# Patient Record
Sex: Female | Born: 1985 | Hispanic: Yes | Marital: Married | Smoking: Never smoker
Health system: Southern US, Community
[De-identification: ages and names within clinical notes are randomized; demographics above are authoritative.]

## PROBLEM LIST (undated history)

## (undated) DIAGNOSIS — R519 Headache, unspecified: Secondary | ICD-10-CM

## (undated) DIAGNOSIS — R51 Headache: Secondary | ICD-10-CM

## (undated) HISTORY — DX: Headache: R51

## (undated) HISTORY — DX: Headache, unspecified: R51.9

## (undated) SURGERY — Surgical Case
Anesthesia: *Unknown

---

## 2014-10-01 DIAGNOSIS — M25552 Pain in left hip: Secondary | ICD-10-CM | POA: Insufficient documentation

## 2016-03-13 ENCOUNTER — Ambulatory Visit
Admission: EM | Admit: 2016-03-13 | Discharge: 2016-03-13 | Disposition: A | Discharge status: Home / Self Care | Payer: BC Managed Care – PPO | Attending: Family Medicine | Admitting: Family Medicine

## 2016-03-13 ENCOUNTER — Encounter: Payer: Self-pay | Admitting: *Deleted

## 2016-03-13 DIAGNOSIS — G43009 Migraine without aura, not intractable, without status migrainosus: Secondary | ICD-10-CM

## 2016-03-13 LAB — RAPID INFLUENZA A&B ANTIGENS: Influenza A (ARMC): NEGATIVE

## 2016-03-13 LAB — RAPID STREP SCREEN (MED CTR MEBANE ONLY): Streptococcus, Group A Screen (Direct): NEGATIVE

## 2016-03-13 LAB — RAPID INFLUENZA A&B ANTIGENS (ARMC ONLY): INFLUENZA B (ARMC): NEGATIVE

## 2016-03-13 MED ORDER — ONDANSETRON 8 MG PO TBDP
8.0000 mg | ORAL_TABLET | Freq: Once | ORAL | Status: AC
  Administered 2016-03-13: 8 mg via ORAL

## 2016-03-13 MED ORDER — ONDANSETRON 8 MG PO TBDP: 8.0000 mg | ORAL_TABLET | Freq: Two times a day (BID) | ORAL | Status: DC

## 2016-03-13 MED ORDER — NAPROXEN 500 MG PO TABS: 500.0000 mg | ORAL_TABLET | Freq: Two times a day (BID) | ORAL | Status: DC

## 2016-03-13 MED ORDER — KETOROLAC TROMETHAMINE 60 MG/2ML IM SOLN
60.0000 mg | Freq: Once | INTRAMUSCULAR | Status: AC
  Administered 2016-03-13: 60 mg via INTRAMUSCULAR

## 2016-03-13 MED ORDER — ONDANSETRON 8 MG PO TBDP: 8.0000 mg | ORAL_TABLET | Freq: Once | ORAL | Status: DC

## 2016-03-13 NOTE — ED Notes (Signed)
Frontal and occipital region head pain, N/V, chills body aches, weakness, and sore throat since approx 1600 today.

## 2016-03-13 NOTE — Discharge Instructions (Signed)
Recurrent Migraine Headache A migraine headache is an intense, throbbing pain on one or both sides of your head. Recurrent migraines keep coming back. A migraine can last for 30 minutes to several hours. CAUSES  The exact cause of a migraine headache is not always known. However, a migraine may be caused when nerves in the brain become irritated and release chemicals that cause inflammation. This causes pain. Certain things may also trigger migraines, such as:   Alcohol.  Smoking.  Stress.  Menstruation.  Aged cheeses.  Foods or drinks that contain nitrates, glutamate, aspartame, or tyramine.  Lack of sleep.  Chocolate.  Caffeine.  Hunger.  Physical exertion.  Fatigue.  Medicines used to treat chest pain (nitroglycerine), birth control pills, estrogen, and some blood pressure medicines. SYMPTOMS   Pain on one or both sides of your head.  Pulsating or throbbing pain.  Severe pain that prevents daily activities.  Pain that is aggravated by any physical activity.  Nausea, vomiting, or both.  Dizziness.  Pain with exposure to bright lights, loud noises, or activity.  General sensitivity to bright lights, loud noises, or smells. Before you get a migraine, you may get warning signs that a migraine is coming (aura). An aura may include:  Seeing flashing lights.  Seeing bright spots, halos, or zigzag lines.  Having tunnel vision or blurred vision.  Having feelings of numbness or tingling.  Having trouble talking.  Having muscle weakness. DIAGNOSIS  A recurrent migraine headache is often diagnosed based on:  Symptoms.  Physical examination.  A CT scan or MRI of your head. These imaging tests cannot diagnose migraines but can help rule out other causes of headaches.  TREATMENT  Medicines may be given for pain and nausea. Medicines can also be given to help prevent recurrent migraines. HOME CARE INSTRUCTIONS  Only take over-the-counter or prescription  medicines for pain or discomfort as directed by your health care provider. The use of long-term narcotics is not recommended.  Lie down in a dark, quiet room when you have a migraine.  Keep a journal to find out what may trigger your migraine headaches. For example, write down:  What you eat and drink.  How much sleep you get.  Any change to your diet or medicines.  Limit alcohol consumption.  Quit smoking if you smoke.  Get 7-9 hours of sleep, or as recommended by your health care provider.  Limit stress.  Keep lights dim if bright lights bother you and make your migraines worse. SEEK MEDICAL CARE IF:   You do not get relief from the medicines given to you.  You have a recurrence of pain.  You have a fever. SEEK IMMEDIATE MEDICAL CARE IF:  Your migraine becomes severe.  You have a stiff neck.  You have loss of vision.  You have muscular weakness or loss of muscle control.  You start losing your balance or have trouble walking.  You feel faint or pass out.  You have severe symptoms that are different from your first symptoms. MAKE SURE YOU:   Understand these instructions.  Will watch your condition.  Will get help right away if you are not doing well or get worse.   This information is not intended to replace advice given to you by your health care provider. Make sure you discuss any questions you have with your health care provider.   Document Released: 08/07/2001 Document Revised: 12/03/2014 Document Reviewed: 07/20/2013 Elsevier Interactive Patient Education 2016 [employer]  

## 2016-03-13 NOTE — ED Provider Notes (Signed)
CSN: [id number]     Arrival date & time 03/13/16  1902 History   First MD Initiated Contact with Patient 03/13/16 2104     Chief Complaint  Patient presents with  . Headache  . Nausea  . Emesis  . Weakness  . Chills  . Generalized Body Aches   (Consider location/radiation/quality/duration/timing/severity/associated sxs/prior Treatment) HPI  A 30 year old female who is accompanied by her husband complaining of frontal temporal and occipital head pain which she describes as a pressure. She has this along with nausea vomiting body  aches weakness and sore throat that started about 1600 hrs. today. She has a history of migraine headaches; she was seeing a physician who had her on propranolol prophylactically, she discontinued this on her own about 2 months ago because of minor palpitations. She does not have any photophobia today. Her migraines would last 2-3 hours and then seemed to abate but this one has been in progress  5 hours. Pain right now is 8 out of 10.    History reviewed. No pertinent past medical history. History reviewed. No pertinent past surgical history. History reviewed. No pertinent family history. Social History  Substance Use Topics  . Smoking status: Never Smoker   . Smokeless tobacco: None  . Alcohol Use: Yes   OB History    No data available     Review of Systems  Constitutional: Positive for activity change and fatigue. Negative for fever and chills.  Eyes: Negative for photophobia and visual disturbance.  Neurological: Positive for weakness and headaches.  All other systems reviewed and are negative.   Allergies  Review of patient's allergies indicates no known allergies.  Home Medications   Prior to Admission medications   Medication Sig Start Date End Date Taking? Authorizing Provider  naproxen (NAPROSYN) 500 MG tablet Take 1 tablet (500 mg total) by mouth 2 (two) times daily with a meal. 03/13/16   Lutricia Feil, PA-C  ondansetron (ZOFRAN ODT)  8 MG disintegrating tablet Take 1 tablet (8 mg total) by mouth 2 (two) times daily. 03/13/16   Lutricia Feil, PA-C   Meds Ordered and Administered this Visit   Medications  ondansetron (ZOFRAN-ODT) disintegrating tablet 8 mg (not administered)  ketorolac (TORADOL) injection 60 mg (60 mg Intramuscular Given 03/13/16 2124)  ondansetron (ZOFRAN-ODT) disintegrating tablet 8 mg (8 mg Oral Given 03/13/16 2119)    BP 118/76 mmHg  Pulse 79  Temp(Src) 97.8 F (36.6 C) (Oral)  Resp 16  Ht  (1.6 m)  Wt 132 lb (59.875 kg)  BMI 23.39 kg/m2  SpO2 100%  LMP 02/11/2016 (Approximate) No data found.   Physical Exam  Constitutional: She is oriented to person, place, and time. She appears well-developed and well-nourished. No distress.  HENT:  Head: Normocephalic and atraumatic.  Eyes: Conjunctivae and EOM are normal. Pupils are equal, round, and reactive to light. Right eye exhibits no discharge. Left eye exhibits no discharge.  Neck: Normal range of motion. Neck supple.  Musculoskeletal: Normal range of motion. She exhibits no edema or tenderness.  Lymphadenopathy:    She has no cervical adenopathy.  Neurological: She is alert and oriented to person, place, and time. She has normal reflexes. She displays normal reflexes. No cranial nerve deficit. She exhibits normal muscle tone. Coordination abnormal.  Skin: Skin is warm and dry. She is not diaphoretic.  Psychiatric: She has a normal mood and affect. Her behavior is normal. Judgment and thought content normal.  Nursing note and vitals reviewed.  ED Course  Procedures (including critical care time)  Labs Review Labs Reviewed  RAPID INFLUENZA A&B ANTIGENS ([facility] ONLY)  RAPID STREP SCREEN (NOT AT [facility])  CULTURE, GROUP A STREP [facility])    Imaging Review No results found.   Visual Acuity Review  Right Eye Distance:   Left Eye Distance:   Bilateral Distance:    Right Eye Near:   Left Eye Near:    Bilateral Near:       Medications  ondansetron (ZOFRAN-ODT) disintegrating tablet 8 mg (not administered)  ketorolac (TORADOL) injection 60 mg (60 mg Intramuscular Given 03/13/16 2124)  ondansetron (ZOFRAN-ODT) disintegrating tablet 8 mg (8 mg Oral Given 03/13/16 2119)     MDM   1. Migraine without aura and without status migrainosus, not intractable    New Prescriptions   NAPROXEN (NAPROSYN) 500 MG TABLET    Take 1 tablet (500 mg total) by mouth 2 (two) times daily with a meal.   ONDANSETRON (ZOFRAN ODT) 8 MG DISINTEGRATING TABLET    Take 1 tablet (8 mg total) by mouth 2 (two) times daily.  Plan: 1. Test/x-ray results and diagnosis reviewed with patient 2. rx as per orders; risks, benefits, potential side effects reviewed with patient 3. Recommend supportive treatment with A beta blocker to figure out triggers. Recommend she return to her physician who had prescribed migraine medication the past to reestablish prophylactic migraine medicines that do not cause side effects. In meantime given a prescription for an anti-inflammatory medication and also Zofran for to assist her with nausea if she has another episode. At the time of discharge her up headache scale had fallen to 1 out of 10 when she came in it was a 9 out of 10. 4. F/u prn if symptoms worsen or don't improve      Lutricia FeilWilliam P Roemer, PA-C 03/13/16 2206

## 2016-03-15 LAB — CULTURE, GROUP A STREP (THRC)

## 2016-08-28 ENCOUNTER — Ambulatory Visit: Payer: BC Managed Care – PPO

## 2016-08-28 VITALS — BP 123/82 | HR 107 | Wt 136.0 lb

## 2016-08-28 DIAGNOSIS — Z113 Encounter for screening for infections with a predominantly sexual mode of transmission: Secondary | ICD-10-CM

## 2016-08-28 DIAGNOSIS — N926 Irregular menstruation, unspecified: Secondary | ICD-10-CM

## 2016-08-28 DIAGNOSIS — Z331 Pregnant state, incidental: Secondary | ICD-10-CM

## 2016-08-28 NOTE — Progress Notes (Unsigned)
Sandra Cardenas presents for NOB nurse interview visit. Pregnancy confirmation done ______.  G- 1.  P- .0 Pregnancy education material explained and given. _No__ cats in the home. NOB labs ordered.Marland Kitchen. HIV labs and Drug screen were explained optional and she did not decline. Drug screen ordered. PNV encouraged. Genetic screening options discussed. Genetic testing:   Pt may discuss with provider. Pt. To follow up with provider in _2_ weeks for NOB physical.  All questions answered.

## 2016-08-30 LAB — URINALYSIS, ROUTINE W REFLEX MICROSCOPIC
Bilirubin, UA: NEGATIVE
Glucose, UA: NEGATIVE
Ketones, UA: NEGATIVE
Leukocytes, UA: NEGATIVE
NITRITE UA: NEGATIVE
Protein, UA: NEGATIVE
RBC, UA: NEGATIVE
Specific Gravity, UA: 1.012 (ref 1.005–1.030)
Urobilinogen, Ur: 0.2 mg/dL (ref 0.2–1.0)
pH, UA: 6.5 (ref 5.0–7.5)

## 2016-08-30 LAB — CBC WITH DIFFERENTIAL/PLATELET
BASOS ABS: 0 10*3/uL (ref 0.0–0.2)
Basos: 0 %
EOS (ABSOLUTE): 0.1 10*3/uL (ref 0.0–0.4)
Eos: 1 %
HEMOGLOBIN: 11.7 g/dL (ref 11.1–15.9)
Hematocrit: 35.1 % (ref 34.0–46.6)
IMMATURE GRANS (ABS): 0.1 10*3/uL (ref 0.0–0.1)
Immature Granulocytes: 1 %
LYMPHS: 16 %
Lymphocytes Absolute: 2.1 10*3/uL (ref 0.7–3.1)
MCH: 31.9 pg (ref 26.6–33.0)
MCHC: 33.3 g/dL (ref 31.5–35.7)
MCV: 96 fL (ref 79–97)
MONOCYTES: 7 %
Monocytes Absolute: 0.9 10*3/uL (ref 0.1–0.9)
NEUTROS PCT: 75 %
Neutrophils Absolute: 9.6 10*3/uL — ABNORMAL HIGH (ref 1.4–7.0)
PLATELETS: 384 10*3/uL — AB (ref 150–379)
RBC: 3.67 x10E6/uL — AB (ref 3.77–5.28)
RDW: 13.4 % (ref 12.3–15.4)
WBC: 12.9 10*3/uL — AB (ref 3.4–10.8)

## 2016-08-30 LAB — URINE CULTURE: ORGANISM ID, BACTERIA: NO GROWTH

## 2016-08-30 LAB — MONITOR DRUG PROFILE 14(MW)
AMPHETAMINE SCREEN URINE: NEGATIVE ng/mL
BARBITURATE SCREEN URINE: NEGATIVE ng/mL
BENZODIAZEPINE SCREEN, URINE: NEGATIVE ng/mL
BUPRENORPHINE, URINE: NEGATIVE ng/mL
CANNABINOIDS UR QL SCN: NEGATIVE ng/mL
Cocaine (Metab) Scrn, Ur: NEGATIVE ng/mL
Creatinine(Crt), U: 103.5 mg/dL (ref 20.0–300.0)
Fentanyl, Urine: NEGATIVE pg/mL
Meperidine Screen, Urine: NEGATIVE ng/mL
Methadone Screen, Urine: NEGATIVE ng/mL
OPIATE SCREEN URINE: NEGATIVE ng/mL
OXYCODONE+OXYMORPHONE UR QL SCN: NEGATIVE ng/mL
PH UR, DRUG SCRN: 6.1 (ref 4.5–8.9)
PHENCYCLIDINE QUANTITATIVE URINE: NEGATIVE ng/mL
PROPOXYPHENE SCREEN URINE: NEGATIVE ng/mL
SPECIFIC GRAVITY: 1.009
Tramadol Screen, Urine: NEGATIVE ng/mL

## 2016-08-30 LAB — RUBELLA SCREEN: Rubella Antibodies, IGG: 1.18 index (ref >0.99)

## 2016-08-30 LAB — ABO AND RH: Rh Factor: POSITIVE

## 2016-08-30 LAB — HEP, RPR, HIV PANEL
HEP B S AG: NEGATIVE
HIV Screen 4th Generation wRfx: NONREACTIVE
RPR: NONREACTIVE

## 2016-08-30 LAB — VARICELLA ZOSTER ANTIBODY, IGG: VARICELLA: 2545 {index} (ref >165)

## 2016-08-30 LAB — ANTIBODY SCREEN: ANTIBODY SCREEN: NEGATIVE

## 2016-08-31 LAB — GC/CHLAMYDIA PROBE AMP
Chlamydia trachomatis, NAA: NEGATIVE
Neisseria gonorrhoeae by PCR: NEGATIVE

## 2016-09-26 ENCOUNTER — Encounter: Payer: BC Managed Care – PPO | Admitting: Obstetrics and Gynecology

## 2016-09-27 ENCOUNTER — Other Ambulatory Visit: Payer: Self-pay | Admitting: Obstetrics and Gynecology

## 2016-09-27 ENCOUNTER — Ambulatory Visit: Payer: BC Managed Care – PPO | Admitting: Obstetrics and Gynecology

## 2016-09-27 ENCOUNTER — Ambulatory Visit (INDEPENDENT_AMBULATORY_CARE_PROVIDER_SITE_OTHER): Payer: BC Managed Care – PPO | Admitting: Obstetrics and Gynecology

## 2016-09-27 VITALS — BP 114/71 | HR 87 | Wt 131.4 lb

## 2016-09-27 DIAGNOSIS — Z23 Encounter for immunization: Secondary | ICD-10-CM

## 2016-09-27 DIAGNOSIS — Z3492 Encounter for supervision of normal pregnancy, unspecified, second trimester: Secondary | ICD-10-CM

## 2016-09-27 NOTE — Progress Notes (Signed)
NEW OB HISTORY AND PHYSICAL  SUBJECTIVE:       Sandra LoganMaria Cardenas is a 30 y.o. G1P0 female, Patient's last menstrual period was 07/02/2016., Estimated Date of Delivery: 04/08/17, [id number], presents today for establishment of Prenatal Care. She has no unusual complaints and complains of nothing now.      Gynecologic History Patient's last menstrual period was 07/02/2016. Normal Contraception: none Last Pap: ?Marland Kitchen. Results were: normal  Obstetric History OB History  Gravida Para Term Preterm AB Living  1            SAB TAB Ectopic Multiple Live Births               # Outcome Date GA Lbr Len/2nd Weight Sex Delivery Anes PTL Lv  1 Current               Past Medical History:  Diagnosis Date  . Frequent headaches     No past surgical history on file.  Current Outpatient Prescriptions on File Prior to Visit  Medication Sig Dispense Refill  . Prenatal Vit-Fe Fumarate-FA (PRENATAL MULTIVITAMIN) TABS tablet Take 1 tablet by mouth daily at 12 noon.    . naproxen (NAPROSYN) 500 MG tablet Take 1 tablet (500 mg total) by mouth 2 (two) times daily with a meal. (Patient not taking: Reported on 09/27/2016) 60 tablet 0  . ondansetron (ZOFRAN ODT) 8 MG disintegrating tablet Take 1 tablet (8 mg total) by mouth 2 (two) times daily. (Patient not taking: Reported on 09/27/2016) 6 tablet 0   No current facility-administered medications on file prior to visit.     Allergies  Allergen Reactions  . Penicillins     Social History   Social History  . Marital status: Married    Spouse name: N/A  . Number of children: N/A  . Years of education: N/A   Occupational History  . Not on file.   Social History Main Topics  . Smoking status: Never Smoker  . Smokeless tobacco: Never Used  . Alcohol use Yes  . Drug use: No  . Sexual activity: Yes    Birth control/ protection: None   Other Topics Concern  . Not on file   Social History Narrative  . No narrative on file    No family history on  file.  The following portions of the patient's history were reviewed and updated as appropriate: allergies, current medications, past OB history, past medical history, past surgical history, past family history, past social history, and problem list.    OBJECTIVE: Initial Physical Exam (New OB)  GENERAL APPEARANCE: alert, well appearing, in no apparent distress, oriented to person, place and time HEAD: normocephalic, atraumatic MOUTH: mucous membranes moist, pharynx normal without lesions and dental hygiene good THYROID: no thyromegaly or masses present BREASTS: not examined LUNGS: clear to auscultation, no wheezes, rales or rhonchi, symmetric air entry HEART: regular rate and rhythm, no murmurs ABDOMEN: soft, nontender, nondistended, no abnormal masses, no epigastric pain, fundus not palpable and FHT present EXTREMITIES: no redness or tenderness in the calves or thighs SKIN: normal coloration and turgor, no rashes LYMPH NODES: no adenopathy palpable NEUROLOGIC: alert, oriented, normal speech, no focal findings or movement disorder noted  PELVIC EXAM EXTERNAL GENITALIA: normal appearing vulva with no masses, tenderness or lesions VAGINA: no abnormal discharge or lesions CERVIX: no lesions or cervical motion tenderness UTERUS: gravid and consistent with 12 weeks ADNEXA: no masses palpable and nontender  ASSESSMENT: Normal pregnancy  PLAN: Prenatal care Desires genetic screening-obtained today  See orders

## 2016-09-27 NOTE — Patient Instructions (Signed)

## 2016-09-27 NOTE — Progress Notes (Signed)
NOB physical- pt is doing well, states she is having some cramping in her lower abd

## 2016-10-01 LAB — CYTOLOGY - PAP

## 2016-10-04 ENCOUNTER — Encounter: Payer: Self-pay | Admitting: Obstetrics and Gynecology

## 2016-10-05 ENCOUNTER — Telehealth: Payer: Self-pay | Admitting: Obstetrics and Gynecology

## 2016-10-05 ENCOUNTER — Encounter: Payer: Self-pay | Admitting: Obstetrics and Gynecology

## 2016-10-05 NOTE — Telephone Encounter (Signed)
PT CALLED AND LEFT A OFFICE MESSAGE STATING SHE WOULD LIKE FOR YOU TO CALL HER BACK.

## 2016-10-05 NOTE — Telephone Encounter (Signed)
Spoke with pt

## 2016-10-08 ENCOUNTER — Encounter: Payer: Self-pay | Admitting: Obstetrics and Gynecology

## 2016-10-11 ENCOUNTER — Encounter: Payer: Self-pay | Admitting: Obstetrics and Gynecology

## 2016-10-25 ENCOUNTER — Ambulatory Visit (INDEPENDENT_AMBULATORY_CARE_PROVIDER_SITE_OTHER): Payer: BC Managed Care – PPO | Admitting: Obstetrics and Gynecology

## 2016-10-25 VITALS — BP 111/70 | HR 111 | Wt 137.4 lb

## 2016-10-25 DIAGNOSIS — Z3402 Encounter for supervision of normal first pregnancy, second trimester: Secondary | ICD-10-CM

## 2016-10-25 LAB — POCT URINALYSIS DIPSTICK
BILIRUBIN UA: NEGATIVE
GLUCOSE UA: NEGATIVE
KETONES UA: NEGATIVE
Nitrite, UA: NEGATIVE
PH UA: 6.5
Protein, UA: NEGATIVE
RBC UA: NEGATIVE
Spec Grav, UA: <=1.005
Urobilinogen, UA: NEGATIVE

## 2016-10-25 NOTE — Progress Notes (Signed)
ROB: Doing well, no complaints. Nausea improved.  Received flu vaccine last visit. RTC in 4 weeks. For anatomy scan next visit.

## 2016-10-25 NOTE — Patient Instructions (Addendum)
Second Trimester of Pregnancy The second trimester is from week 13 through week 28 (months 4 through 6). The second trimester is often a time when you feel your best. Your body has also adjusted to being pregnant, and you begin to feel better physically. Usually, morning sickness has lessened or quit completely, you may have more energy, and you may have an increase in appetite. The second trimester is also a time when the fetus is growing rapidly. At the end of the sixth month, the fetus is about 9 inches long and weighs about 1 pounds. You will likely begin to feel the baby move (quickening) between 18 and 20 weeks of the pregnancy. Body changes during your second trimester Your body continues to go through many changes during your second trimester. The changes vary from woman to woman.  Your weight will continue to increase. You will notice your lower abdomen bulging out.  You may begin to get stretch marks on your hips, abdomen, and breasts.  You may develop headaches that can be relieved by medicines. The medicines should be approved by your health care provider.  You may urinate more often because the fetus is pressing on your bladder.  You may develop or continue to have heartburn as a result of your pregnancy.  You may develop constipation because certain hormones are causing the muscles that push waste through your intestines to slow down.  You may develop hemorrhoids or swollen, bulging veins (varicose veins).  You may have back pain. This is caused by:  Weight gain.  Pregnancy hormones that are relaxing the joints in your pelvis.  A shift in weight and the muscles that support your balance.  Your breasts will continue to grow and they will continue to become tender.  Your gums may bleed and may be sensitive to brushing and flossing.  Dark spots or blotches (chloasma, mask of pregnancy) may develop on your face. This will likely fade after the baby is born.  A dark line  from your belly button to the pubic area (linea nigra) may appear. This will likely fade after the baby is born.  You may have changes in your hair. These can include thickening of your hair, rapid growth, and changes in texture. Some women also have hair loss during or after pregnancy, or hair that feels dry or thin. Your hair will most likely return to normal after your baby is born. What to expect at prenatal visits During a routine prenatal visit:  You will be weighed to make sure you and the fetus are growing normally.  Your blood pressure will be taken.  Your abdomen will be measured to track your baby's growth.  The fetal heartbeat will be listened to.  Any test results from the previous visit will be discussed. Your health care provider may ask you:  How you are feeling.  If you are feeling the baby move.  If you have had any abnormal symptoms, such as leaking fluid, bleeding, severe headaches, or abdominal cramping.  If you are using any tobacco products, including cigarettes, chewing tobacco, and electronic cigarettes.  If you have any questions. Other tests that may be performed during your second trimester include:  Blood tests that check for:  Low iron levels (anemia).  Gestational diabetes (between 24 and 28 weeks).  Rh antibodies. This is to check for a protein on red blood cells (Rh factor).  Urine tests to check for infections, diabetes, or protein in the urine.  An ultrasound to   confirm the proper growth and development of the baby.  An amniocentesis to check for possible genetic problems.  Fetal screens for spina bifida and Down syndrome.  HIV (human immunodeficiency virus) testing. Routine prenatal testing includes screening for HIV, unless you choose not to have this test. Follow these instructions at home: Eating and drinking  Continue to eat regular, healthy meals.  Avoid raw meat, uncooked cheese, cat litter boxes, and soil used by cats. These  carry germs that can cause birth defects in the baby.  Take your prenatal vitamins.  Take 1500-2000 mg of calcium daily starting at the 20th week of pregnancy until you deliver your baby.  If you develop constipation:  Take over-the-counter or prescription medicines.  Drink enough fluid to keep your urine clear or pale yellow.  Eat foods that are high in fiber, such as fresh fruits and vegetables, whole grains, and beans.  Limit foods that are high in fat and processed sugars, such as fried and sweet foods. Activity  Exercise only as directed by your health care provider. Experiencing uterine cramps is a good sign to stop exercising.  Avoid heavy lifting, wear low heel shoes, and practice good posture.  Wear your seat belt at all times when driving.  Rest with your legs elevated if you have leg cramps or low back pain.  Wear a good support bra for breast tenderness.  Do not use hot tubs, steam rooms, or saunas. Lifestyle  Avoid all smoking, herbs, alcohol, and unprescribed drugs. These chemicals affect the formation and growth of the baby.  Do not use any products that contain nicotine or tobacco, such as cigarettes and e-cigarettes. If you need help quitting, ask your health care provider.  A sexual relationship may be continued unless your health care provider directs you otherwise. General instructions  Follow your health care provider's instructions regarding medicine use. There are medicines that are either safe or unsafe to take during pregnancy.  Take warm sitz baths to soothe any pain or discomfort caused by hemorrhoids. Use hemorrhoid cream if your health care provider approves.  If you develop varicose veins, wear support hose. Elevate your feet for 15 minutes, 3-4 times a day. Limit salt in your diet.  Visit your dentist if you have not gone yet during your pregnancy. Use a soft toothbrush to brush your teeth and be gentle when you floss.  Keep all follow-up  prenatal visits as told by your health care provider. This is important. Contact a health care provider if:  You have dizziness.  You have mild pelvic cramps, pelvic pressure, or nagging pain in the abdominal area.  You have persistent nausea, vomiting, or diarrhea.  You have a bad smelling vaginal discharge.  You have pain with urination. Get help right away if:  You have a fever.  You are leaking fluid from your vagina.  You have spotting or bleeding from your vagina.  You have severe abdominal cramping or pain.  You have rapid weight gain or weight loss.  You have shortness of breath with chest pain.  You notice sudden or extreme swelling of your face, hands, ankles, feet, or legs.  You have not felt your baby move in over an hour.  You have severe headaches that do not go away with medicine.  You have vision changes. Summary  The second trimester is from week 13 through week 28 (months 4 through 6). It is also a time when the fetus is growing rapidly.  Your body goes   through many changes during pregnancy. The changes vary from woman to woman.  Avoid all smoking, herbs, alcohol, and unprescribed drugs. These chemicals affect the formation and growth your baby.  Do not use any tobacco products, such as cigarettes, chewing tobacco, and e-cigarettes. If you need help quitting, ask your health care provider.  Contact your health care provider if you have any questions. Keep all prenatal visits as told by your health care provider. This is important. This information is not intended to replace advice given to you by your health care provider. Make sure you discuss any questions you have with your health care provider. Document Released: 11/06/2001 Document Revised: 04/19/2016 Document Reviewed: 01/13/2013 Elsevier Interactive Patient Education  2017 [employer] Exercise During Pregnancy For people of all ages, exercise is an important part of being healthy. Exercise  improves heart and lung function and helps to maintain strength, flexibility, and a healthy body weight. Exercise also boosts energy levels and elevates mood. For most women, maintaining an exercise routine throughout pregnancy is recommended. It is only on rare occasions and with certain medical conditions or pregnancy complications that women may be asked to limit or avoid exercise during pregnancy. What are some other benefits to exercising during pregnancy? Along with maintaining strength and flexibility, exercising throughout pregnancy can help to:  Keep strength in muscles that are very important during labor and childbirth.  Decrease low back pain during pregnancy.  Decrease the risk of developing gestational diabetes mellitus (GDM).  Improve blood sugar (glucose) control for women who have GDM.  Decrease the risk of developing preeclampsia. This is a serious condition that causes high blood pressure along with other symptoms, such as swelling and headaches.  Decrease the risk of cesarean delivery.  Speed up the recovery after giving birth. How often should I exercise? Unless your health care provider gives you different instructions, you should try to exercise on most days or all days of the week. In general, try to exercise with moderate intensity for about 150 minutes per week. This can be spread out across several days, such as exercising for 30 minutes per day on 5 days of each week. You can tell that you are exercising at a moderate intensity if you have a higher heart rate and faster breathing, but you are still able to hold a conversation. What types of moderate-intensity exercise are recommended during pregnancy? There are many types of exercise that are safe for you to do during pregnancy. Unless your health care provider gives you different instructions, do a variety of exercises that safely increase your heart and breathing (cardiopulmonary) rates and help you to build and  maintain muscle strength (strength training). You should always be able to talk in full sentences while exercising during pregnancy. Some examples of exercising that is safe to do during pregnancy include:  Brisk walking or hiking.  Swimming.  Water aerobics.  Riding a stationary bike.  Strength training.  Modified yoga or Pilates. Tell your instructor that you are pregnant. Avoid overstretching and avoid lying on your back for long periods of time.  Running or jogging. Only choose this type of exercise if:  You ran or jogged regularly before your pregnancy.  You can run or jog and still talk in complete sentences. What types of exercise should I not do during pregnancy? Depending on your level of fitness and whether you exercised regularly before your pregnancy, you may be advised to limit vigorous-intensity exercise during your pregnancy. You can tell that  you are exercising at a vigorous intensity if you are breathing much harder and faster and cannot hold a conversation while exercising. Some examples of exercising that you should avoid during pregnancy include:  Contact sports.  Activities that place you at risk for falling on or being hit in the belly, such as downhill skiing, water skiing, surfing, rock climbing, cycling, gymnastics, and horseback riding.  Scuba diving.  Sky diving.  Yoga or Pilates in a room that is heated to extreme temperatures ("hot yoga" or "hot Pilates").  Jogging or running, unless you ran or jogged regularly before your pregnancy. While jogging or running, you should always be able to talk in full sentences. Do not run or jog so vigorously that you are unable to have a conversation.  If you are not used to exercising at elevation (more than 6,000 feet above sea level), do not do so during your pregnancy. When should I avoid exercising during pregnancy? Certain medical conditions can make it unsafe to exercise during pregnancy, or they may increase  your risk of miscarriage or early labor and birth. Some of these conditions include:  Some types of heart disease.  Some types of lung disease.  Placenta previa. This is when the placenta partially or completely covers the opening of the uterus (cervix).  Frequent bleeding from the vagina during your pregnancy.  Incompetent cervix. This is when your cervix does not remain as tightly closed during pregnancy as it should.  Premature labor.  Ruptured membranes. This is when the protective sac (amniotic sac) opens up and amniotic fluid leaks from your vagina.  Severely low blood count (anemia).  Preeclampsia or pregnancy-caused high blood pressure.  Carrying more than one baby (multiple gestation) and having an additional risk of early labor.  Poorly controlled diabetes.  Being severely underweight or severely overweight.  Intrauterine growth restriction. This is when your baby's growth and development during pregnancy are slower than expected.  Other medical conditions. Ask your health care provider if any apply to you. What else should I know about exercising during pregnancy? You should take these precautions while exercising during pregnancy:  Avoid overheating.  Wear loose-fitting, breathable clothes.  Do not exercise in very high temperatures.  Avoid dehydration. Drink enough water before, during, and after exercise to keep your urine clear or pale yellow.  Avoid overstretching. Because of hormone changes during pregnancy, it is easy to overstretch muscles, tendons, and ligaments during pregnancy.  Start slowly and ask your health care provider to recommend types of exercise that are safe for you, if exercising regularly is new for you. Pregnancy is not a time for exercising to lose weight. When should I seek medical care? You should stop exercising and call your health care provider if you have any unusual symptoms, such as:  Mild uterine contractions or abdominal  cramping.  Dizziness that does not improve with rest. When should I seek immediate medical care? You should stop exercising and call your local emergency services (911 in the [place] if you have any unusual symptoms, such as:  Sudden, severe pain in your low back or your belly.  Uterine contractions or abdominal cramping that do not improve with rest.  Chest pain.  Bleeding or fluid leaking from your vagina.  Shortness of breath. This information is not intended to replace advice given to you by your health care provider. Make sure you discuss any questions you have with your health care provider. Document Released: 11/12/2005 Document Revised: 04/11/2016 Document Reviewed: 01/20/2015  Elsevier Interactive Patient Education  2017 [employer]  

## 2016-11-16 ENCOUNTER — Encounter: Payer: BC Managed Care – PPO | Admitting: Certified Nurse Midwife

## 2016-11-16 ENCOUNTER — Ambulatory Visit (INDEPENDENT_AMBULATORY_CARE_PROVIDER_SITE_OTHER): Payer: BC Managed Care – PPO | Admitting: Certified Nurse Midwife

## 2016-11-16 ENCOUNTER — Other Ambulatory Visit: Payer: BC Managed Care – PPO

## 2016-11-16 ENCOUNTER — Encounter: Payer: Self-pay | Admitting: Certified Nurse Midwife

## 2016-11-16 ENCOUNTER — Ambulatory Visit (INDEPENDENT_AMBULATORY_CARE_PROVIDER_SITE_OTHER): Payer: BC Managed Care – PPO

## 2016-11-16 VITALS — BP 107/80 | HR 78 | Wt 139.6 lb

## 2016-11-16 DIAGNOSIS — Z3492 Encounter for supervision of normal pregnancy, unspecified, second trimester: Secondary | ICD-10-CM

## 2016-11-16 DIAGNOSIS — Z3402 Encounter for supervision of normal first pregnancy, second trimester: Secondary | ICD-10-CM

## 2016-11-16 LAB — POCT URINALYSIS DIPSTICK
Bilirubin, UA: NEGATIVE
Blood, UA: NEGATIVE
GLUCOSE UA: NEGATIVE
Ketones, UA: NEGATIVE
LEUKOCYTES UA: NEGATIVE
NITRITE UA: NEGATIVE
PH UA: 6
Protein, UA: NEGATIVE
Spec Grav, UA: 1.01
Urobilinogen, UA: NEGATIVE

## 2016-11-16 NOTE — Progress Notes (Signed)
ROB-Pt doing well. Questions about pregnancy weight gain and healthy eating answered. US reviewed. Red flag symptoms and when to call reviewed. RTC x 4 wks or sooner if needed.   Indications: Anatomy Findings:  Singleton intrauterine pregnancy is visualized with FHR at 143 BPM. Biometrics give an (U/S) Gestational age of [redacted] weeks and 4 days and an (U/S) EDD of 04/15/17; this correlates with the clinically established EDD of 04/08/17. This is patient's first ultrasound with this pregnancy.  Fetal presentation is vertex, spine variable.  EFW: 252 grams ( 0 lbs. 9 oz. ) Placenta: Posterior, grade 1 and remote to cervix by 3.7 cm. AFI: Subjectively adequate with a MVP of 2.5 cm.  Anatomic survey is complete and appears WNL. Gender - Female.   Right and left ovaries were not visualized.  There is no evidence of a corpus luteal cyst. Survey of the adnexa demonstrates no adnexal masses. There is no free peritoneal fluid in the cul de sac.  Impression: 1. 18 week 4 day Viable Singleton Intrauterine pregnancy by U/S. 2. (U/S) EDD consistent with Clinically established (LMP) EDD of 04/08/17.  3. Normal appearing anatomy scan.  Recommendations: 1.Clinical correlation with the patient's History and Physical Exam.   Revonda Humphreyeresa Elliott, RDMS, RVT  Ultrasound report reviewed and agree with the above findings.   Gunnar BullaJenkins Michelle Spenser Cong, CNM

## 2016-11-16 NOTE — Patient Instructions (Signed)
How a Baby Grows During Pregnancy Introduction Pregnancy begins when a female's sperm enters a female's egg (fertilization). This happens in one of the tubes (fallopian tubes) that connect the ovaries to the womb (uterus). The fertilized egg is called an embryo until it reaches 10 weeks. From 10 weeks until birth, it is called a fetus. The fertilized egg moves down the fallopian tube to the uterus. Then it implants into the lining of the uterus and begins to grow. The developing fetus receives oxygen and nutrients through the pregnant woman's bloodstream and the tissues that grow (placenta) to support the fetus. The placenta is the life support system for the fetus. It provides nutrition and removes waste. Learning as much as you can about your pregnancy and how your baby is developing can help you enjoy the experience. It can also make you aware of when there might be a problem and when to ask questions. How long does a typical pregnancy last? A pregnancy usually lasts 280 days, or about 40 weeks. Pregnancy is divided into three trimesters:  First trimester: 0-13 weeks.  Second trimester: 14-27 weeks.  Third trimester: 28-40 weeks. The day when your baby is considered ready to be born (full term) is your estimated date of delivery. How does my baby develop month by month? First month  The fertilized egg attaches to the inside of the uterus.  Some cells will form the placenta. Others will form the fetus.  The arms, legs, brain, spinal cord, lungs, and heart begin to develop.  At the end of the first month, the heart begins to beat. Second month  The bones, inner ear, eyelids, hands, and feet form.  The genitals develop.  By the end of 8 weeks, all major organs are developing. Third month  All of the internal organs are forming.  Teeth develop below the gums.  Bones and muscles begin to grow. The spine can flex.  The skin is transparent.  Fingernails and toenails begin to  form.  Arms and legs continue to grow longer, and hands and feet develop.  The fetus is about 3 in (7.6 cm) long. Fourth month  The placenta is completely formed.  The external sex organs, neck, outer ear, eyebrows, eyelids, and fingernails are formed.  The fetus can hear, swallow, and move its arms and legs.  The kidneys begin to produce urine.  The skin is covered with a white waxy coating (vernix) and very fine hair (lanugo). Fifth month  The fetus moves around more and can be felt for the first time (quickening).  The fetus starts to sleep and wake up and may begin to suck its finger.  The nails grow to the end of the fingers.  The organ in the digestive system that makes bile (gallbladder) functions and helps to digest the nutrients.  If your baby is a girl, eggs are present in her ovaries. If your baby is a boy, testicles start to move down into his scrotum. Sixth month  The lungs are formed, but the fetus is not yet able to breathe.  The eyes open. The brain continues to develop.  Your baby has fingerprints and toe prints. Your baby's hair grows thicker.  At the end of the second trimester, the fetus is about 9 in (22.9 cm) long. Seventh month  The fetus kicks and stretches.  The eyes are developed enough to sense changes in light.  The hands can make a grasping motion.  The fetus responds to sound. Eighth month    All organs and body systems are fully developed and functioning.  Bones harden and taste buds develop. The fetus may hiccup.  Certain areas of the brain are still developing. The skull remains soft. Ninth month  The fetus gains about  lb (0.23 kg) each week.  The lungs are fully developed.  Patterns of sleep develop.  The fetus's head typically moves into a head-down position (vertex) in the uterus to prepare for birth. If the buttocks move into a vertex position instead, the baby is breech.  The fetus weighs 6-9 lbs (2.72-4.08 kg) and is  19-20 in (48.26-50.8 cm) long. What can I do to have a healthy pregnancy and help my baby develop?  Eating and Drinking  Eat a healthy diet.  Talk with your health care provider to make sure that you are getting the nutrients that you and your baby need.  Visit www.choosemyplate.gov to learn about creating a healthy diet.  Gain a healthy amount of weight during pregnancy as advised by your health care provider. This is usually 25-35 pounds. You may need to:  Gain more if you were underweight before getting pregnant or if you are pregnant with more than one baby.  Gain less if you were overweight or obese when you got pregnant. Medicines and Vitamins  Take prenatal vitamins as directed by your health care provider. These include vitamins such as folic acid, iron, calcium, and vitamin D. They are important for healthy development.  Take medicines only as directed by your health care provider. Read labels and ask a pharmacist or your health care provider whether over-the-counter medicines, supplements, and prescription drugs are safe to take during pregnancy. Activities  Be physically active as advised by your health care provider. Ask your health care provider to recommend activities that are safe for you to do, such as walking or swimming.  Do not participate in strenuous or extreme sports.  Lifestyle  Do not drink alcohol.  Do not use any tobacco products, including cigarettes, chewing tobacco, or electronic cigarettes. If you need help quitting, ask your health care provider.  Do not use illegal drugs. Safety  Avoid exposure to mercury, lead, or other heavy metals. Ask your health care provider about common sources of these heavy metals.  Avoid listeria infection during pregnancy. Follow these precautions:  Do not eat soft cheeses or deli meats.  Do not eat hot dogs unless they have been warmed up to the point of steaming, such as in the microwave oven.  Do not drink  unpasteurized milk.  Avoid toxoplasmosis infection during pregnancy. Follow these precautions:  Do not change your cat's litter box, if you have a cat. Ask someone else to do this for you.  Wear gardening gloves while working in the yard. General Instructions  Keep all follow-up visits as directed by your health care provider. This is important. This includes prenatal care and screening tests.  Manage any chronic health conditions. Work closely with your health care provider to keep conditions, such as diabetes, under control. How do I know if my baby is developing well? At each prenatal visit, your health care provider will do several different tests to check on your health and keep track of your baby's development. These include:  Fundal height.  Your health care provider will measure your growing belly from top to bottom using a tape measure.  Your health care provider will also feel your belly to determine your baby's position.  Heartbeat.  An ultrasound in the first trimester   can confirm pregnancy and show a heartbeat, depending on how far along you are.  Your health care provider will check your baby's heart rate at every prenatal visit.  As you get closer to your delivery date, you may have regular fetal heart rate monitoring to make sure that your baby is not in distress.  Second trimester ultrasound.  This ultrasound checks your baby's development. It also indicates your baby's gender. What should I do if I have concerns about my baby's development? Always talk with your health care provider about any concerns that you may have. This information is not intended to replace advice given to you by your health care provider. Make sure you discuss any questions you have with your health care provider. Document Released: 04/30/2008 Document Revised: 04/19/2016 Document Reviewed: 04/21/2014  2017 Elsevier  

## 2016-11-26 NOTE — L&D Delivery Note (Signed)
Delivery Note At  a viable and healthy female was delivered via  (Presentation:OA ;  ).  APGAR: 8, 9; weight 5#10oz by Dr Greggory Keenefrancesco  .   Placenta status: delivered intact with 3 vessel  Cord by Dr Greggory Keenefrancesco:  with the following complications: none  Anesthesia:  none Episiotomy:  none Lacerations:  2nd gree with left labia extension Suture Repair: 3.0 vicryl rapide Est. Blood Loss (mL):  350  Mom to postpartum.  Baby to Couplet care / Skin to Skin.  Melody NIKE Shambley, CNM 04/16/2017, 11:11 AM

## 2016-12-14 ENCOUNTER — Ambulatory Visit (INDEPENDENT_AMBULATORY_CARE_PROVIDER_SITE_OTHER): Payer: BC Managed Care – PPO | Admitting: Certified Nurse Midwife

## 2016-12-14 ENCOUNTER — Encounter: Payer: Self-pay | Admitting: Certified Nurse Midwife

## 2016-12-14 VITALS — BP 100/54 | HR 83 | Wt 144.2 lb

## 2016-12-14 DIAGNOSIS — Z3402 Encounter for supervision of normal first pregnancy, second trimester: Secondary | ICD-10-CM

## 2016-12-14 LAB — POCT URINALYSIS DIPSTICK
Bilirubin, UA: NEGATIVE
Blood, UA: NEGATIVE
GLUCOSE UA: NEGATIVE
Ketones, UA: NEGATIVE
LEUKOCYTES UA: NEGATIVE
Nitrite, UA: NEGATIVE
PROTEIN UA: NEGATIVE
Urobilinogen, UA: NEGATIVE
pH, UA: 7

## 2016-12-14 NOTE — Progress Notes (Signed)
ROB- no complaints.  

## 2016-12-14 NOTE — Progress Notes (Signed)
ROB-Pt doing well. Discussed exercise in pregnancy. No questions or concerns. Reviewed red flag symptoms and when to call. RTC x 4-5 weeks for glucola and ROB.

## 2016-12-14 NOTE — Patient Instructions (Signed)

## 2016-12-27 ENCOUNTER — Ambulatory Visit (INDEPENDENT_AMBULATORY_CARE_PROVIDER_SITE_OTHER): Payer: BC Managed Care – PPO | Admitting: Certified Nurse Midwife

## 2016-12-27 ENCOUNTER — Encounter: Payer: Self-pay | Admitting: Certified Nurse Midwife

## 2016-12-27 VITALS — BP 120/79 | HR 85 | Wt 145.3 lb

## 2016-12-27 DIAGNOSIS — Z3402 Encounter for supervision of normal first pregnancy, second trimester: Secondary | ICD-10-CM

## 2016-12-27 DIAGNOSIS — O26899 Other specified pregnancy related conditions, unspecified trimester: Secondary | ICD-10-CM

## 2016-12-27 DIAGNOSIS — R109 Unspecified abdominal pain: Secondary | ICD-10-CM

## 2016-12-27 LAB — POCT URINALYSIS DIPSTICK
Bilirubin, UA: NEGATIVE
Blood, UA: NEGATIVE
Glucose, UA: NEGATIVE
Ketones, UA: NEGATIVE
Leukocytes, UA: NEGATIVE
Nitrite, UA: NEGATIVE
PH UA: 6
PROTEIN UA: NEGATIVE
SPEC GRAV UA: 1.01
UROBILINOGEN UA: NEGATIVE

## 2016-12-27 LAB — FETAL FIBRONECTIN: FETAL FIBRONECTIN: NEGATIVE

## 2016-12-27 NOTE — Progress Notes (Signed)
Sharpe lower abdominal pain since yesterday, worse upon lying down and the pain is constant. No c/o UTI symptoms. No vaginal bleeding or leaking fluid.

## 2016-12-27 NOTE — Progress Notes (Signed)
Subjective:   Sandra LoganMaria Puerta is a 31 y.o. G1P0 6477w3d walk in problem visit.   Patient reports sharp lower abdominal pain since last night. This pain increase with movement and when she is flat in bed. The pain starts in the center of her abdomen and radiates through her groin and up her sides.  Denies contractions, vaginal bleeding or leaking of fluid. Denies dysuria and vaginal itching. Reports good fetal movement.  She took a warm shower, but did not take any Tylenol last night or this morning.   Pt has not had anything in her vagina in the last 24 hours.   The following portions of the patient's history were reviewed and updated as appropriate: allergies, current medications, past family history, past medical history, past social history, past surgical history and problem list.   Objective:  BP 120/79   Pulse 85   Wt 145 lb 4.8 oz (65.9 kg)   LMP 07/02/2016   BMI 25.74 kg/m   FHT: Fetal Heart Rate (bpm): 148  Uterine Size: Fundal Height: 25 cm  Fetal Movement: Movement: Present    Abdomen:  soft, gravid, appropriate for gestational age,non-tender. Negative McBurneys and Murphys sign.   Vaginal:  Discharge present, white and thin    Cervix: Visually closed   Results for orders placed or performed in visit on 12/27/16 (from the past 24 hour(s))  POCT urinalysis dipstick     Status: None   Collection Time: 12/27/16  8:27 AM  Result Value Ref Range   Color, UA yellow    Clarity, UA cloudy    Glucose, UA negative    Bilirubin, UA negative    Ketones, UA negative    Spec Grav, UA 1.010    Blood, UA negative    pH, UA 6.0    Protein, UA negative    Urobilinogen, UA negative    Nitrite, UA negative    Leukocytes, UA Negative Negative    Assessment:    Pregnancy:  G1P0 at 5177w3d  1. Supervision of normal first pregnancy in second trimester - POCT urinalysis dipstick - Culture, OB Urine - NuSwab Vaginitis Plus (VG+)  2. Abdominal pain affecting pregnancy - POCT  urinalysis dipstick - Culture, OB Urine - NuSwab Vaginitis Plus (VG+)  Plan:    FFN and NuSwab collected, will contact patient with results.   Preterm labor symptoms: vaginal bleeding, contractions and leaking of fluid reviewed in detail.  Fetal movement precautions reviewed.  Follow up in 2 weeks as previously scheduled or sooner if symptoms worsen or fail to improve.   Gunnar BullaJenkins Michelle Stephanine Reas, CNM

## 2016-12-27 NOTE — Patient Instructions (Signed)
Round Ligament Pain Introduction The round ligament is a cord of muscle and tissue that helps to support the uterus. It can become a source of pain during pregnancy if it becomes stretched or twisted as the baby grows. The pain usually begins in the second trimester of pregnancy, and it can come and go until the baby is delivered. It is not a serious problem, and it does not cause harm to the baby. Round ligament pain is usually a short, sharp, and pinching pain, but it can also be a dull, lingering, and aching pain. The pain is felt in the lower side of the abdomen or in the groin. It usually starts deep in the groin and moves up to the outside of the hip area. Pain can occur with:  A sudden change in position.  Rolling over in bed.  Coughing or sneezing.  Physical activity. Follow these instructions at home: Watch your condition for any changes. Take these steps to help with your pain:  When the pain starts, relax. Then try:  Sitting down.  Flexing your knees up to your abdomen.  Lying on your side with one pillow under your abdomen and another pillow between your legs.  Sitting in a warm bath for 15-20 minutes or until the pain goes away.  Take over-the-counter and prescription medicines only as told by your health care provider.  Move slowly when you sit and stand.  Avoid long walks if they cause pain.  Stop or lessen your physical activities if they cause pain. Contact a health care provider if:  Your pain does not go away with treatment.  You feel pain in your back that you did not have before.  Your medicine is not helping. Get help right away if:  You develop a fever or chills.  You develop uterine contractions.  You develop vaginal bleeding.  You develop nausea or vomiting.  You develop diarrhea.  You have pain when you urinate. This information is not intended to replace advice given to you by your health care provider. Make sure you discuss any questions  you have with your health care provider. Document Released: 08/21/2008 Document Revised: 04/19/2016 Document Reviewed: 01/19/2015  2017 Elsevier  

## 2016-12-29 LAB — CULTURE, OB URINE

## 2016-12-29 LAB — URINE CULTURE, OB REFLEX: ORGANISM ID, BACTERIA: NO GROWTH

## 2016-12-31 LAB — NUSWAB VAGINITIS PLUS (VG+)
CANDIDA ALBICANS, NAA: NEGATIVE
CANDIDA GLABRATA, NAA: NEGATIVE
Chlamydia trachomatis, NAA: NEGATIVE
Neisseria gonorrhoeae, NAA: NEGATIVE
TRICH VAG BY NAA: NEGATIVE

## 2017-01-11 ENCOUNTER — Ambulatory Visit (INDEPENDENT_AMBULATORY_CARE_PROVIDER_SITE_OTHER): Payer: BC Managed Care – PPO | Admitting: Certified Nurse Midwife

## 2017-01-11 ENCOUNTER — Encounter: Payer: Self-pay | Admitting: Certified Nurse Midwife

## 2017-01-11 ENCOUNTER — Other Ambulatory Visit: Payer: BC Managed Care – PPO

## 2017-01-11 VITALS — BP 98/61 | HR 87 | Wt 146.0 lb

## 2017-01-11 DIAGNOSIS — Z23 Encounter for immunization: Secondary | ICD-10-CM | POA: Diagnosis not present

## 2017-01-11 DIAGNOSIS — Z3402 Encounter for supervision of normal first pregnancy, second trimester: Secondary | ICD-10-CM

## 2017-01-11 LAB — POCT URINALYSIS DIPSTICK
BILIRUBIN UA: NEGATIVE
Glucose, UA: NEGATIVE
Ketones, UA: NEGATIVE
Leukocytes, UA: NEGATIVE
NITRITE UA: NEGATIVE
PH UA: 6
PROTEIN UA: NEGATIVE
RBC UA: NEGATIVE
Spec Grav, UA: <=1.005
UROBILINOGEN UA: NEGATIVE

## 2017-01-11 NOTE — Progress Notes (Signed)
ROB-Pt doing well. Reports relief with use of abdominal support. Discused CBC and use of hydrotherapy in labor. Reviewed red flag symptoms and when to call. RTC x 2 weeks

## 2017-01-11 NOTE — Progress Notes (Signed)
OB- tdap and 1 hour today.

## 2017-01-11 NOTE — Patient Instructions (Signed)
Tdap Vaccine (Tetanus, Diphtheria and Pertussis): What You Need to Know 1. Why get vaccinated? Tetanus, diphtheria and pertussis are very serious diseases. Tdap vaccine can protect us from these diseases. And, Tdap vaccine given to pregnant women can protect newborn babies against pertussis. TETANUS (Lockjaw) is rare in the [place] today. It causes painful muscle tightening and stiffness, usually all over the body.  It can lead to tightening of muscles in the head and neck so you can't open your mouth, swallow, or sometimes even breathe. Tetanus kills about 1 out of 10 people who are infected even after receiving the best medical care.  DIPHTHERIA is also rare in the [place] today. It can cause a thick coating to form in the back of the throat.  It can lead to breathing problems, heart failure, paralysis, and death.  PERTUSSIS (Whooping Cough) causes severe coughing spells, which can cause difficulty breathing, vomiting and disturbed sleep.  It can also lead to weight loss, incontinence, and rib fractures. Up to 2 in 100 adolescents and 5 in 100 adults with pertussis are hospitalized or have complications, which could include pneumonia or death.  These diseases are caused by bacteria. Diphtheria and pertussis are spread from person to person through secretions from coughing or sneezing. Tetanus enters the body through cuts, scratches, or wounds. Before vaccines, as many as 200,000 cases of diphtheria, 200,000 cases of pertussis, and hundreds of cases of tetanus, were reported in the [place] each year. Since vaccination began, reports of cases for tetanus and diphtheria have dropped by about 99% and for pertussis by about 80%. 2. Tdap vaccine Tdap vaccine can protect adolescents and adults from tetanus, diphtheria, and pertussis. One dose of Tdap is routinely given at age 11 or 12. People who did not get Tdap at that age should get it as soon as possible. Tdap is especially  important for healthcare professionals and anyone having close contact with a baby younger than 12 months. Pregnant women should get a dose of Tdap during every pregnancy, to protect the newborn from pertussis. Infants are most at risk for severe, life-threatening complications from pertussis. Another vaccine, called Td, protects against tetanus and diphtheria, but not pertussis. A Td booster should be given every 10 years. Tdap may be given as one of these boosters if you have never gotten Tdap before. Tdap may also be given after a severe cut or burn to prevent tetanus infection. Your doctor or the person giving you the vaccine can give you more information. Tdap may safely be given at the same time as other vaccines. 3. Some people should not get this vaccine  A person who has ever had a life-threatening allergic reaction after a previous dose of any diphtheria, tetanus or pertussis containing vaccine, OR has a severe allergy to any part of this vaccine, should not get Tdap vaccine. Tell the person giving the vaccine about any severe allergies.  Anyone who had coma or long repeated seizures within 7 days after a childhood dose of DTP or DTaP, or a previous dose of Tdap, should not get Tdap, unless a cause other than the vaccine was found. They can still get Td.  Talk to your doctor if you: ? have seizures or another nervous system problem, ? had severe pain or swelling after any vaccine containing diphtheria, tetanus or pertussis, ? ever had a condition called Guillain-Barr Syndrome (GBS), ? aren't feeling well on the day the shot is scheduled. 4. Risks With any medicine, including   vaccines, there is a chance of side effects. These are usually mild and go away on their own. Serious reactions are also possible but are rare. Most people who get Tdap vaccine do not have any problems with it. Mild problems following Tdap: (Did not interfere with activities)  Pain where the shot was given (about  3 in 4 adolescents or 2 in 3 adults)  Redness or swelling where the shot was given (about 1 person in 5)  Mild fever of at least 100.4F (up to about 1 in 25 adolescents or 1 in 100 adults)  Headache (about 3 or 4 people in 10)  Tiredness (about 1 person in 3 or 4)  Nausea, vomiting, diarrhea, stomach ache (up to 1 in 4 adolescents or 1 in 10 adults)  Chills, sore joints (about 1 person in 10)  Body aches (about 1 person in 3 or 4)  Rash, swollen glands (uncommon)  Moderate problems following Tdap: (Interfered with activities, but did not require medical attention)  Pain where the shot was given (up to 1 in 5 or 6)  Redness or swelling where the shot was given (up to about 1 in 16 adolescents or 1 in 12 adults)  Fever over 102F (about 1 in 100 adolescents or 1 in 250 adults)  Headache (about 1 in 7 adolescents or 1 in 10 adults)  Nausea, vomiting, diarrhea, stomach ache (up to 1 or 3 people in 100)  Swelling of the entire arm where the shot was given (up to about 1 in 500).  Severe problems following Tdap: (Unable to perform usual activities; required medical attention)  Swelling, severe pain, bleeding and redness in the arm where the shot was given (rare).  Problems that could happen after any vaccine:  People sometimes faint after a medical procedure, including vaccination. Sitting or lying down for about 15 minutes can help prevent fainting, and injuries caused by a fall. Tell your doctor if you feel dizzy, or have vision changes or ringing in the ears.  Some people get severe pain in the shoulder and have difficulty moving the arm where a shot was given. This happens very rarely.  Any medication can cause a severe allergic reaction. Such reactions from a vaccine are very rare, estimated at fewer than 1 in a million doses, and would happen within a few minutes to a few hours after the vaccination. As with any medicine, there is a very remote chance of a vaccine  causing a serious injury or death. The safety of vaccines is always being monitored. For more information, visit: www.cdc.gov/vaccinesafety/ 5. What if there is a serious problem? What should I look for? Look for anything that concerns you, such as signs of a severe allergic reaction, very high fever, or unusual behavior. Signs of a severe allergic reaction can include hives, swelling of the face and throat, difficulty breathing, a fast heartbeat, dizziness, and weakness. These would usually start a few minutes to a few hours after the vaccination. What should I do?  If you think it is a severe allergic reaction or other emergency that can't wait, call 9-1-1 or get the person to the nearest hospital. Otherwise, call your doctor.  Afterward, the reaction should be reported to the Vaccine Adverse Event Reporting System (VAERS). Your doctor might file this report, or you can do it yourself through the VAERS web site at www.vaers.hhs.gov, or by calling 1-800-822-7967. ? VAERS does not give medical advice. 6. The National Vaccine Injury Compensation Program The National   Vaccine Injury Compensation Program (VICP) is a federal program that was created to compensate people who may have been injured by certain vaccines. Persons who believe they may have been injured by a vaccine can learn about the program and about filing a claim by calling 1-800-338-2382 or visiting the VICP website at www.hrsa.gov/vaccinecompensation. There is a time limit to file a claim for compensation. 7. How can I learn more?  Ask your doctor. He or she can give you the vaccine package insert or suggest other sources of information.  Call your local or state health department.  Contact the Centers for Disease Control and Prevention (CDC): ? Call 1-800-232-4636 (1-800-CDC-INFO) or ? Visit CDC's website at www.cdc.gov/vaccines CDC Tdap Vaccine VIS (01/19/14) This information is not intended to replace advice given to you by your  health care provider. Make sure you discuss any questions you have with your health care provider. Document Released: 05/13/2012 Document Revised: 08/02/2016 Document Reviewed: 08/02/2016 Elsevier Interactive Patient Education  2017 Elsevier Inc.  

## 2017-01-12 LAB — HEMOGLOBIN AND HEMATOCRIT, BLOOD
Hematocrit: 32.8 % — ABNORMAL LOW (ref 34.0–46.6)
Hemoglobin: 11.3 g/dL (ref 11.1–15.9)

## 2017-01-12 LAB — GLUCOSE TOLERANCE, 1 HOUR: Glucose, 1Hr PP: 92 mg/dL (ref 65–199)

## 2017-01-23 ENCOUNTER — Telehealth: Payer: Self-pay | Admitting: Certified Nurse Midwife

## 2017-01-23 NOTE — Telephone Encounter (Signed)
Spoke with pt

## 2017-01-23 NOTE — Telephone Encounter (Signed)
Pt went to doctor today and she was prescribed some allergy medicine and she wanted to make sure she can take that being pregnant, she is 29 weeks.

## 2017-01-25 ENCOUNTER — Encounter: Payer: Self-pay | Admitting: Certified Nurse Midwife

## 2017-01-25 ENCOUNTER — Ambulatory Visit (INDEPENDENT_AMBULATORY_CARE_PROVIDER_SITE_OTHER): Payer: BC Managed Care – PPO | Admitting: Certified Nurse Midwife

## 2017-01-25 VITALS — BP 106/68 | HR 95 | Wt 147.0 lb

## 2017-01-25 DIAGNOSIS — Z3493 Encounter for supervision of normal pregnancy, unspecified, third trimester: Secondary | ICD-10-CM

## 2017-01-25 LAB — POCT URINALYSIS DIPSTICK
BILIRUBIN UA: NEGATIVE
GLUCOSE UA: NEGATIVE
Ketones, UA: NEGATIVE
Leukocytes, UA: NEGATIVE
NITRITE UA: NEGATIVE
Protein, UA: NEGATIVE
RBC UA: NEGATIVE
SPEC GRAV UA: 1.02
UROBILINOGEN UA: NEGATIVE
pH, UA: 5

## 2017-01-25 NOTE — Progress Notes (Signed)
ROB-Pt doing well. Went to PCP on Wednesday for sinus congestion and cough, Rx Claritin and Flonase; reports relief of symptoms. Childbirth Preparation classes start 02/04/2017. No breastfeeding class yet. [facility] and [facility]. Reviewed cord blood banking options. RTC x 2 weeks for ROB.

## 2017-01-25 NOTE — Patient Instructions (Addendum)

## 2017-01-28 ENCOUNTER — Encounter: Payer: Self-pay | Admitting: Certified Nurse Midwife

## 2017-02-08 ENCOUNTER — Encounter: Payer: Self-pay | Admitting: Certified Nurse Midwife

## 2017-02-08 ENCOUNTER — Ambulatory Visit (INDEPENDENT_AMBULATORY_CARE_PROVIDER_SITE_OTHER): Payer: BC Managed Care – PPO | Admitting: Certified Nurse Midwife

## 2017-02-08 VITALS — BP 105/78 | HR 97 | Wt 145.6 lb

## 2017-02-08 DIAGNOSIS — Z3493 Encounter for supervision of normal pregnancy, unspecified, third trimester: Secondary | ICD-10-CM

## 2017-02-08 LAB — POCT URINALYSIS DIPSTICK
BILIRUBIN UA: NEGATIVE
GLUCOSE UA: NEGATIVE
KETONES UA: NEGATIVE
Leukocytes, UA: NEGATIVE
NITRITE UA: NEGATIVE
Protein, UA: NEGATIVE
RBC UA: NEGATIVE
Spec Grav, UA: 1.025 (ref 1.030–1.035)
Urobilinogen, UA: NEGATIVE (ref ?–2.0)
pH, UA: 5 (ref 5.0–8.0)

## 2017-02-08 NOTE — Progress Notes (Signed)
Pt is c/o back pain. Vomited x3 when she had worse back pain than normal.

## 2017-02-08 NOTE — Patient Instructions (Signed)

## 2017-02-08 NOTE — Progress Notes (Signed)
ROB- doing well. Has occasional back discomfort that she is using belly band that helps. Also suggested tylenol and ice as needed. Denies LOF, vaginal bleeding and contractions. Positive fetal movement. She asked about eating deli meat. Suggested that if she has to have it to heat meat well. Reviewed PTL precautions. ROB 2 wks.   Doreene BurkeAnnie Eulises Kijowski, CNM

## 2017-02-21 ENCOUNTER — Telehealth: Payer: Self-pay

## 2017-02-21 NOTE — Telephone Encounter (Signed)
Called pt informed her that FMLA paperwork is ready for her to pick up. Pt states she will get it at her Monday appt

## 2017-02-25 ENCOUNTER — Encounter: Payer: Self-pay | Admitting: Certified Nurse Midwife

## 2017-02-25 ENCOUNTER — Ambulatory Visit (INDEPENDENT_AMBULATORY_CARE_PROVIDER_SITE_OTHER): Payer: BC Managed Care – PPO | Admitting: Certified Nurse Midwife

## 2017-02-25 VITALS — BP 97/55 | HR 84 | Wt 144.4 lb

## 2017-02-25 DIAGNOSIS — Z3403 Encounter for supervision of normal first pregnancy, third trimester: Secondary | ICD-10-CM

## 2017-02-25 LAB — POCT URINALYSIS DIPSTICK
Bilirubin, UA: NEGATIVE
Glucose, UA: NEGATIVE
Ketones, UA: NEGATIVE
Leukocytes, UA: NEGATIVE
NITRITE UA: NEGATIVE
PROTEIN UA: NEGATIVE
RBC UA: NEGATIVE
SPEC GRAV UA: 1.025 (ref 1.030–1.035)
UROBILINOGEN UA: NEGATIVE (ref ?–2.0)
pH, UA: 6 (ref 5.0–8.0)

## 2017-02-25 NOTE — Progress Notes (Signed)
ROB-Discussed OTC treatment for hemorrhoids and constipation. Reviewed PTL precautions, kick counts, and when to call. RTC x 2 weeks for ROB and 36 week cultures.

## 2017-02-25 NOTE — Telephone Encounter (Signed)
Forms given to patient.

## 2017-02-25 NOTE — Progress Notes (Signed)
ROB- Pos sprain rt knee at work x 1 week ago. Better with elevation, and ice. Pos constipation. Pos hemorrhoid.

## 2017-02-25 NOTE — Patient Instructions (Addendum)
Braxton Hicks Contractions Contractions of the uterus can occur throughout pregnancy, but they are not always a sign that you are in labor. You may have practice contractions called Braxton Hicks contractions. These false labor contractions are sometimes confused with true labor. What are Braxton Hicks contractions? Braxton Hicks contractions are tightening movements that occur in the muscles of the uterus before labor. Unlike true labor contractions, these contractions do not result in opening (dilation) and thinning of the cervix. Toward the end of pregnancy (32-34 weeks), Braxton Hicks contractions can happen more often and may become stronger. These contractions are sometimes difficult to tell apart from true labor because they can be very uncomfortable. You should not feel embarrassed if you go to the hospital with false labor. Sometimes, the only way to tell if you are in true labor is for your health care provider to look for changes in the cervix. The health care provider will do a physical exam and may monitor your contractions. If you are not in true labor, the exam should show that your cervix is not dilating and your water has not broken. If there are no prenatal problems or other health problems associated with your pregnancy, it is completely safe for you to be sent home with false labor. You may continue to have Braxton Hicks contractions until you go into true labor. How can I tell the difference between true labor and false labor?  Differences ? False labor ? Contractions last 30-70 seconds.: Contractions are usually shorter and not as strong as true labor contractions. ? Contractions become very regular.: Contractions are usually irregular. ? Discomfort is usually felt in the top of the uterus, and it spreads to the lower abdomen and low back.: Contractions are often felt in the front of the lower abdomen and in the groin. ? Contractions do not go away with walking.: Contractions may  go away when you walk around or change positions while lying down. ? Contractions usually become more intense and increase in frequency.: Contractions get weaker and are shorter-lasting as time goes on. ? The cervix dilates and gets thinner.: The cervix usually does not dilate or become thin. Follow these instructions at home:  Take over-the-counter and prescription medicines only as told by your health care provider.  Keep up with your usual exercises and follow other instructions from your health care provider.  Eat and drink lightly if you think you are going into labor.  If Braxton Hicks contractions are making you uncomfortable: ? Change your position from lying down or resting to walking, or change from walking to resting. ? Sit and rest in a tub of warm water. ? Drink enough fluid to keep your urine clear or pale yellow. Dehydration may cause these contractions. ? Do slow and deep breathing several times an hour.  Keep all follow-up prenatal visits as told by your health care provider. This is important. Contact a health care provider if:  You have a fever.  You have continuous pain in your abdomen. Get help right away if:  Your contractions become stronger, more regular, and closer together.  You have fluid leaking or gushing from your vagina.  You pass blood-tinged mucus (bloody show).  You have bleeding from your vagina.  You have low back pain that you never had before.  You feel your baby's head pushing down and causing pelvic pressure.  Your baby is not moving inside you as much as it used to. Summary  Contractions that occur before labor are   called Braxton Hicks contractions, false labor, or practice contractions.  Braxton Hicks contractions are usually shorter, weaker, farther apart, and less regular than true labor contractions. True labor contractions usually become progressively stronger and regular and they become more frequent.  Manage discomfort from  [facility] contractions by changing position, resting in a warm bath, drinking plenty of water, or practicing deep breathing. This information is not intended to replace advice given to you by your health care provider. Make sure you discuss any questions you have with your health care provider. Document Released: 11/12/2005 Document Revised: 10/01/2016 Document Reviewed: 10/01/2016 Elsevier Interactive Patient Education  2017 Elsevier Inc. Ii Minor Illnesses and Medications in Pregnancy  Cold/Flu:  Sudafed for congestion- Robitussin (plain) for cough- Tylenol for discomfort.  Please follow the directions on the label.  Try not to take any more than needed.  OTC Saline nasal spray and air humidifier or cool-mist  Vaporizer to sooth nasal irritation and to loosen congestion.  It is also important to increase intake of non carbonated fluids, especially if you have a fever.  Constipation:  Colace-2 capsules at bedtime; Metamucil- follow directions on label; Senokot- 1 tablet at bedtime.  Any one of these medications can be used.  It is also very important to increase fluids and fruits along with regular exercise.  If problem persists please call the office.  Diarrhea:  Kaopectate as directed on the label.  Eat a bland diet and increase fluids.  Avoid highly seasoned foods.  Headache:  Tylenol 1 or 2 tablets every 3-4 hours as needed  Indigestion:  Maalox, Mylanta, Tums or Rolaids- as directed on label.  Also try to eat small meals and avoid fatty, greasy or spicy foods.  Nausea with or without Vomiting:  Nausea in pregnancy is caused by increased levels of hormones in the body which influence the digestive system and cause irritation when stomach acids accumulate.  Symptoms usually subside after 1st trimester of pregnancy.  Try the following: 1. Keep saltines, graham crackers or dry toast by your bed to eat upon awakening. 2. Don't let your stomach get empty.  Try to eat 5-6 small meals per day  instead of 3 large ones. 3. Avoid greasy fatty or highly seasoned foods.  4. Take OTC Unisom 1 tablet at bed time along with OTC Vitamin B6 25-50 mg 3 times per day.    If nausea continues with vomiting and you are unable to keep down food and fluids you may need a prescription medication.  Please notify your provider.   Sore throat:  Chloraseptic spray, throat lozenges and or plain Tylenol.  Vaginal Yeast Infection:  OTC Monistat for 7 days as directed on label.  If symptoms do not resolve within a week notify provider.  If any of the above problems do not subside with recommended treatment please call the office for further assistance.   Do not take Aspirin, Advil, Motrin or Ibuprofen.  * * OTC= Over the counter

## 2017-03-12 ENCOUNTER — Ambulatory Visit (INDEPENDENT_AMBULATORY_CARE_PROVIDER_SITE_OTHER): Payer: BC Managed Care – PPO | Admitting: Certified Nurse Midwife

## 2017-03-12 VITALS — BP 88/54 | HR 79 | Wt 149.9 lb

## 2017-03-12 DIAGNOSIS — Z113 Encounter for screening for infections with a predominantly sexual mode of transmission: Secondary | ICD-10-CM

## 2017-03-12 DIAGNOSIS — Z3493 Encounter for supervision of normal pregnancy, unspecified, third trimester: Secondary | ICD-10-CM

## 2017-03-12 DIAGNOSIS — Z3685 Encounter for antenatal screening for Streptococcus B: Secondary | ICD-10-CM

## 2017-03-12 LAB — POCT URINALYSIS DIPSTICK
Bilirubin, UA: NEGATIVE
Blood, UA: NEGATIVE
Glucose, UA: NEGATIVE
KETONES UA: NEGATIVE
LEUKOCYTES UA: NEGATIVE
Nitrite, UA: NEGATIVE
PH UA: 6.5 (ref 5.0–8.0)
PROTEIN UA: NEGATIVE
Spec Grav, UA: 1.01 (ref 1.010–1.025)
Urobilinogen, UA: 0.2 E.U./dL

## 2017-03-12 NOTE — Patient Instructions (Signed)

## 2017-03-12 NOTE — Progress Notes (Signed)
ROB- cultures obtained, pt is having some lower abd pain

## 2017-03-14 LAB — GC/CHLAMYDIA PROBE AMP
Chlamydia trachomatis, NAA: NEGATIVE
Neisseria gonorrhoeae by PCR: NEGATIVE

## 2017-03-15 LAB — STREP GP B NAA+RFLX: Strep Gp B NAA+Rflx: NEGATIVE

## 2017-03-19 ENCOUNTER — Encounter: Payer: Self-pay | Admitting: Certified Nurse Midwife

## 2017-03-19 ENCOUNTER — Ambulatory Visit (INDEPENDENT_AMBULATORY_CARE_PROVIDER_SITE_OTHER): Payer: BC Managed Care – PPO | Admitting: Certified Nurse Midwife

## 2017-03-19 VITALS — BP 96/73 | HR 103 | Wt 150.3 lb

## 2017-03-19 DIAGNOSIS — Z3403 Encounter for supervision of normal first pregnancy, third trimester: Secondary | ICD-10-CM

## 2017-03-19 LAB — POCT URINALYSIS DIPSTICK
BILIRUBIN UA: NEGATIVE
Glucose, UA: NEGATIVE
Ketones, UA: NEGATIVE
LEUKOCYTES UA: NEGATIVE
NITRITE UA: NEGATIVE
PH UA: 6 (ref 5.0–8.0)
PROTEIN UA: NEGATIVE
RBC UA: NEGATIVE
Spec Grav, UA: 1.01 (ref 1.010–1.025)
Urobilinogen, UA: NEGATIVE E.U./dL — AB

## 2017-03-19 NOTE — Patient Instructions (Signed)

## 2017-03-20 NOTE — Progress Notes (Signed)
ROB-Pt doing well. Her spouse and mother are here with her at today's appointment. Discussed use of dates, red raspberry leaf tea, and evening primrose oil as preparation for labor and birth. 36 week cultures obtained last week, pt is group beta strep negative. Reviewed red flag symptoms and when to call. RTC x 1 week for ROB.

## 2017-03-20 NOTE — Progress Notes (Signed)
ROB-Pt doing well, accompanied by spouse. Discussed use of dates, red raspberry leaf tea, and evening primrose oil as preparation for labor and birth. 36 week cultures collected. Reviewed red flag symptoms and when to call. RTC x 1 weeks for ROB.

## 2017-03-26 ENCOUNTER — Encounter: Payer: Self-pay | Admitting: Certified Nurse Midwife

## 2017-03-26 ENCOUNTER — Ambulatory Visit (INDEPENDENT_AMBULATORY_CARE_PROVIDER_SITE_OTHER): Payer: BC Managed Care – PPO | Admitting: Certified Nurse Midwife

## 2017-03-26 VITALS — BP 114/74 | HR 96 | Wt 149.4 lb

## 2017-03-26 DIAGNOSIS — R8299 Other abnormal findings in urine: Secondary | ICD-10-CM

## 2017-03-26 DIAGNOSIS — R82998 Other abnormal findings in urine: Secondary | ICD-10-CM

## 2017-03-26 DIAGNOSIS — Z3403 Encounter for supervision of normal first pregnancy, third trimester: Secondary | ICD-10-CM

## 2017-03-26 LAB — POCT URINALYSIS DIPSTICK
Bilirubin, UA: NEGATIVE
Blood, UA: NEGATIVE
Glucose, UA: NEGATIVE
KETONES UA: NEGATIVE
Nitrite, UA: NEGATIVE
PH UA: 6 (ref 5.0–8.0)
PROTEIN UA: NEGATIVE
Spec Grav, UA: 1.015 (ref 1.010–1.025)
UROBILINOGEN UA: 0.2 U/dL

## 2017-03-26 NOTE — Progress Notes (Signed)
Sandra Cardenas-Pt reports irregular contractions, otherwise doing well. Her spouse and mother are here with her for today's appointment. Managed expectations regarding length of pregnancy and induction of labor. Reviewed red flag symptoms and when to call. RTC x 1 week for Sandra Cardenas.

## 2017-03-26 NOTE — Patient Instructions (Signed)

## 2017-03-26 NOTE — Addendum Note (Signed)
Addended by: Jackquline Denmark on: 03/26/2017 05:22 PM   Modules accepted: Orders

## 2017-03-28 LAB — CULTURE, OB URINE

## 2017-03-28 LAB — URINE CULTURE, OB REFLEX: Organism ID, Bacteria: NO GROWTH

## 2017-04-02 ENCOUNTER — Encounter: Payer: Self-pay | Admitting: Certified Nurse Midwife

## 2017-04-02 ENCOUNTER — Ambulatory Visit (INDEPENDENT_AMBULATORY_CARE_PROVIDER_SITE_OTHER): Payer: BC Managed Care – PPO | Admitting: Certified Nurse Midwife

## 2017-04-02 VITALS — BP 106/76 | HR 87 | Wt 149.7 lb

## 2017-04-02 DIAGNOSIS — O480 Post-term pregnancy: Secondary | ICD-10-CM

## 2017-04-02 DIAGNOSIS — Z3403 Encounter for supervision of normal first pregnancy, third trimester: Secondary | ICD-10-CM

## 2017-04-02 LAB — POCT URINALYSIS DIPSTICK
BILIRUBIN UA: NEGATIVE
Blood, UA: NEGATIVE
Glucose, UA: NEGATIVE
KETONES UA: NEGATIVE
NITRITE UA: NEGATIVE
PH UA: 6 (ref 5.0–8.0)
PROTEIN UA: NEGATIVE
Spec Grav, UA: 1.01 (ref 1.010–1.025)
Urobilinogen, UA: NEGATIVE E.U./dL — AB

## 2017-04-02 NOTE — Progress Notes (Signed)
ROB-Pt doing well, answered questions related to postpartum care. Reviewed red flag symptoms and when to call. RTC x 1 week for BPP and ROB.

## 2017-04-02 NOTE — Patient Instructions (Signed)
Fetal Movement Counts  Patient Name: ________________________________________________ Patient Due Date: ____________________  What is a fetal movement count?  A fetal movement count is the number of times that you feel your baby move during a certain amount of time. This may also be called a fetal kick count. A fetal movement count is recommended for every pregnant woman. You may be asked to start counting fetal movements as early as week 28 of your pregnancy.  Pay attention to when your baby is most active. You may notice your baby's sleep and wake cycles. You may also notice things that make your baby move more. You should do a fetal movement count:  · When your baby is normally most active.  · At the same time each day.    A good time to count movements is while you are resting, after having something to eat and drink.  How do I count fetal movements?  1. Find a quiet, comfortable area. Sit, or lie down on your side.  2. Write down the date, the start time and stop time, and the number of movements that you felt between those two times. Take this information with you to your health care visits.  3. For 2 hours, count kicks, flutters, swishes, rolls, and jabs. You should feel at least 10 movements during 2 hours.  4. You may stop counting after you have felt 10 movements.  5. If you do not feel 10 movements in 2 hours, have something to eat and drink. Then, keep resting and counting for 1 hour. If you feel at least 4 movements during that hour, you may stop counting.  Contact a health care provider if:  · You feel fewer than 4 movements in 2 hours.  · Your baby is not moving like he or she usually does.  Date: ____________ Start time: ____________ Stop time: ____________ Movements: ____________  Date: ____________ Start time: ____________ Stop time: ____________ Movements: ____________  Date: ____________ Start time: ____________ Stop time: ____________ Movements: ____________  Date: ____________ Start time:  ____________ Stop time: ____________ Movements: ____________  Date: ____________ Start time: ____________ Stop time: ____________ Movements: ____________  Date: ____________ Start time: ____________ Stop time: ____________ Movements: ____________  Date: ____________ Start time: ____________ Stop time: ____________ Movements: ____________  Date: ____________ Start time: ____________ Stop time: ____________ Movements: ____________  Date: ____________ Start time: ____________ Stop time: ____________ Movements: ____________  This information is not intended to replace advice given to you by your health care provider. Make sure you discuss any questions you have with your health care provider.  Document Released: 12/12/2006 Document Revised: 07/11/2016 Document Reviewed: 12/22/2015  Elsevier Interactive Patient Education © 2017 [employer]vier Inc[employer]

## 2017-04-09 ENCOUNTER — Encounter: Payer: BC Managed Care – PPO | Admitting: Certified Nurse Midwife

## 2017-04-10 ENCOUNTER — Ambulatory Visit (INDEPENDENT_AMBULATORY_CARE_PROVIDER_SITE_OTHER): Payer: BC Managed Care – PPO

## 2017-04-10 ENCOUNTER — Encounter: Payer: Self-pay | Admitting: Certified Nurse Midwife

## 2017-04-10 ENCOUNTER — Ambulatory Visit (INDEPENDENT_AMBULATORY_CARE_PROVIDER_SITE_OTHER): Payer: BC Managed Care – PPO | Admitting: Certified Nurse Midwife

## 2017-04-10 VITALS — BP 110/69 | HR 91 | Wt 153.9 lb

## 2017-04-10 DIAGNOSIS — O480 Post-term pregnancy: Secondary | ICD-10-CM | POA: Diagnosis not present

## 2017-04-10 DIAGNOSIS — Z3403 Encounter for supervision of normal first pregnancy, third trimester: Secondary | ICD-10-CM

## 2017-04-10 LAB — POCT URINALYSIS DIPSTICK
BILIRUBIN UA: NEGATIVE
Blood, UA: NEGATIVE
Glucose, UA: NEGATIVE
Ketones, UA: NEGATIVE
LEUKOCYTES UA: NEGATIVE
NITRITE UA: NEGATIVE
PH UA: 6 (ref 5.0–8.0)
PROTEIN UA: NEGATIVE
Spec Grav, UA: 1.01 (ref 1.010–1.025)
UROBILINOGEN UA: 0.2 U/dL

## 2017-04-10 NOTE — Progress Notes (Addendum)
ROB , doing well. No complaints. BPP and NST today for post dates. BPP 7/8 for low normal fluid level. NST reactive . Baseline 120's, moderate variabilty, no decels, accelerations present, occasional contractions. Discussed plan of care. Sched[facility]. Reviewed fetal kick counts 2 x day and labor precautions. Discussed induction process of cervical ripening Monday night with induction on Tuesday. Pt and her husband verbalize understanding and agree to plan.   Doreene BurkeAnnie Dilan Fullenwider, CNM

## 2017-04-10 NOTE — Patient Instructions (Signed)

## 2017-04-15 ENCOUNTER — Inpatient Hospital Stay
Admission: EM | Admit: 2017-04-15 | Discharge: 2017-04-18 | DRG: 775 | Disposition: A | Discharge status: Home / Self Care | Payer: BC Managed Care – PPO | Attending: Obstetrics and Gynecology | Admitting: Obstetrics and Gynecology

## 2017-04-15 DIAGNOSIS — Z3490 Encounter for supervision of normal pregnancy, unspecified, unspecified trimester: Secondary | ICD-10-CM | POA: Diagnosis present

## 2017-04-15 DIAGNOSIS — O9902 Anemia complicating childbirth: Secondary | ICD-10-CM | POA: Diagnosis present

## 2017-04-15 DIAGNOSIS — Z3A41 41 weeks gestation of pregnancy: Secondary | ICD-10-CM

## 2017-04-15 DIAGNOSIS — O480 Post-term pregnancy: Principal | ICD-10-CM | POA: Diagnosis present

## 2017-04-15 DIAGNOSIS — D649 Anemia, unspecified: Secondary | ICD-10-CM | POA: Diagnosis present

## 2017-04-15 DIAGNOSIS — Z880 Allergy status to penicillin: Secondary | ICD-10-CM | POA: Diagnosis not present

## 2017-04-15 LAB — CBC
HCT: 32.2 % — ABNORMAL LOW (ref 35.0–47.0)
Hemoglobin: 11.1 g/dL — ABNORMAL LOW (ref 12.0–16.0)
MCH: 31.4 pg (ref 26.0–34.0)
MCHC: 34.5 g/dL (ref 32.0–36.0)
MCV: 91.1 fL (ref 80.0–100.0)
PLATELETS: 262 10*3/uL (ref 150–440)
RBC: 3.54 MIL/uL — ABNORMAL LOW (ref 3.80–5.20)
RDW: 14.8 % — AB (ref 11.5–14.5)
WBC: 13.2 10*3/uL — AB (ref 3.6–11.0)

## 2017-04-15 LAB — TYPE AND SCREEN
ABO/RH(D): O POS
Antibody Screen: NEGATIVE

## 2017-04-15 MED ORDER — OXYTOCIN 40 UNITS IN LACTATED RINGERS INFUSION - SIMPLE MED: 1.0000 m[IU]/min | INTRAVENOUS | Status: DC

## 2017-04-15 MED ORDER — LACTATED RINGERS IV SOLN
500.0000 mL | INTRAVENOUS | Status: DC | PRN
  Administered 2017-04-16: 500 mL via INTRAVENOUS

## 2017-04-15 MED ORDER — MISOPROSTOL 25 MCG QUARTER TABLET
ORAL_TABLET | ORAL | Status: AC
  Administered 2017-04-15: 50 ug via VAGINAL
  Filled 2017-04-15: qty 1

## 2017-04-15 MED ORDER — FENTANYL CITRATE (PF) 100 MCG/2ML IJ SOLN
50.0000 ug | INTRAMUSCULAR | Status: DC | PRN
  Administered 2017-04-16: 100 ug via INTRAVENOUS
  Administered 2017-04-16: 50 ug via INTRAVENOUS
  Filled 2017-04-15 (×2): qty 2

## 2017-04-15 MED ORDER — LACTATED RINGERS IV SOLN
INTRAVENOUS | Status: DC
  Administered 2017-04-15 – 2017-04-16 (×3): via INTRAVENOUS

## 2017-04-15 MED ORDER — TERBUTALINE SULFATE 1 MG/ML IJ SOLN: 0.2500 mg | Freq: Once | INTRAMUSCULAR | Status: DC | PRN

## 2017-04-15 MED ORDER — SOD CITRATE-CITRIC ACID 500-334 MG/5ML PO SOLN
30.0000 mL | ORAL | Status: DC | PRN
  Filled 2017-04-15: qty 30

## 2017-04-15 MED ORDER — OXYTOCIN 40 UNITS IN LACTATED RINGERS INFUSION - SIMPLE MED
2.5000 [IU]/h | INTRAVENOUS | Status: DC
  Filled 2017-04-15: qty 1000

## 2017-04-15 MED ORDER — ACETAMINOPHEN 325 MG PO TABS: 650.0000 mg | ORAL_TABLET | ORAL | Status: DC | PRN

## 2017-04-15 MED ORDER — MISOPROSTOL 25 MCG QUARTER TABLET
50.0000 ug | ORAL_TABLET | ORAL | Status: DC
  Administered 2017-04-15: 50 ug via VAGINAL
  Filled 2017-04-15: qty 1

## 2017-04-15 MED ORDER — ONDANSETRON HCL 4 MG/2ML IJ SOLN: 4.0000 mg | Freq: Four times a day (QID) | INTRAMUSCULAR | Status: DC | PRN

## 2017-04-15 MED ORDER — OXYTOCIN BOLUS FROM INFUSION: 500.0000 mL | Freq: Once | INTRAVENOUS | Status: DC

## 2017-04-15 MED ORDER — LIDOCAINE HCL (PF) 1 % IJ SOLN: 30.0000 mL | INTRAMUSCULAR | Status: DC | PRN

## 2017-04-15 NOTE — H&P (Signed)
Obstetric History and Physical  Sandra Cardenas is a 31 y.o. G1P0 with IUP at [redacted]w[redacted]d presenting for scheduled IOL. Patient states she has been having  none contractions, none vaginal bleeding, intact membranes, with active fetal movement.    Prenatal Course Source [facility] Care: [facility]  Pregnancy complications or risks:none  Prenatal labs and studies: ABO, Rh: O/Positive/-- (10/03 1603) Antibody: Negative (10/03 1603) Rubella: 1.18 (10/03 1603) RPR: Non Reactive (10/03 1603)  HBsAg: Negative (10/03 1603)  HIV: Non Reactive (10/03 1603)  GBS: negative 1 hr Glucola  normal Genetic screening normal Anatomy US normal  Past Medical History:  Diagnosis Date  . Frequent headaches     History reviewed. No pertinent surgical history.  OB History  Gravida Para Term Preterm AB Living  1            SAB TAB Ectopic Multiple Live Births               # Outcome Date GA Lbr Len/2nd Weight Sex Delivery Anes PTL Lv  1 Current               Social History   Social History  . Marital status: Married    Spouse name: N/A  . Number of children: N/A  . Years of education: N/A   Social History Main Topics  . Smoking status: Never Smoker  . Smokeless tobacco: Never Used  . Alcohol use No  . Drug use: No  . Sexual activity: Yes    Birth control/ protection: None   Other Topics Concern  . None   Social History Narrative  . None    Family History  Problem Relation Age of Onset  . Diabetes Paternal Grandmother   . Pancreatic cancer Paternal Grandmother   . Diabetes Paternal Grandfather   . Leukemia Maternal Grandmother   . Breast cancer Neg Hx   . Ovarian cancer Neg Hx   . Colon cancer Neg Hx   . Heart disease Neg Hx     Prescriptions Prior to Admission  Medication Sig Dispense Refill Last Dose  . Ascorbic Acid (VITAMIN C) 100 MG tablet Take 100 mg by mouth daily.   Taking  . Calcium Carb-Cholecalciferol (CALCIUM 600-D PO) Take by mouth.   Taking  . EQL EVENING PRIMROSE OIL PO Take  by mouth.   Taking  . fluticasone (FLONASE) 50 MCG/ACT nasal spray Place 1 spray into both nostrils daily.   Taking  . Inulin (FIBER CHOICE PO) Take by mouth.   Taking  . loratadine (CLARITIN) 10 MG tablet Take 10 mg by mouth daily.   Taking  . Prenatal Vit-Fe Fumarate-FA (PRENATAL MULTIVITAMIN) TABS tablet Take 1 tablet by mouth daily at 12 noon.   Taking    Allergies  Allergen Reactions  . Penicillins     Review of Systems: Negative except for what is mentioned in HPI.  Physical Exam: BP 124/75 (BP Location: Left Arm)   Pulse 95   Temp 98.2 F (36.8 C) (Oral)   Resp 17   Ht 5\' 3"  (1.6 m)   Wt 153 lb (69.4 kg)   LMP 07/02/2016   BMI 27.10 kg/m  GENERAL: Well-developed, well-nourished female in no acute distress.  LUNGS: Clear to auscultation bilaterally.  HEART: Regular rate and rhythm. ABDOMEN: Soft, nontender, nondistended, gravid. EXTREMITIES: Nontender, no edema, 2+ distal pulses. Cervical Exam: Dilation: 1 Effacement (%): 40 Cervical Position: Posterior Station: -3 Exam by:: M.Newton FHT:  Baseline rate 135 bpm   Variability moderate  Accelerations present   Decelerations none Contractions: Every 10-20 mins not noted by patient   Pertinent Labs/Studies:   No results found for this or any previous visit (from the past 24 hour(s)).  Assessment : Sandra Cardenas is a 31 y.o. G1P0 at 3847w0d being admitted for labor.  Plan: Labor: Expectant management.  Induction/Augmentation as needed, per protocol, will start with cervical ripening with cytotec. FWB: Reassuring fetal heart tracing.  GBS negative Delivery plan: Hopeful for vaginal delivery  Akash Winski, CNM [facility]

## 2017-04-15 NOTE — OB Triage Note (Signed)
Scheduled induction of labor for post dates

## 2017-04-16 DIAGNOSIS — O480 Post-term pregnancy: Secondary | ICD-10-CM

## 2017-04-16 DIAGNOSIS — Z3A41 41 weeks gestation of pregnancy: Secondary | ICD-10-CM

## 2017-04-16 MED ORDER — IBUPROFEN 600 MG PO TABS
600.0000 mg | ORAL_TABLET | Freq: Four times a day (QID) | ORAL | Status: DC
  Administered 2017-04-16: 600 mg via ORAL
  Filled 2017-04-16: qty 1

## 2017-04-16 MED ORDER — IBUPROFEN 600 MG PO TABS
600.0000 mg | ORAL_TABLET | Freq: Four times a day (QID) | ORAL | Status: DC
  Administered 2017-04-16 – 2017-04-18 (×7): 600 mg via ORAL
  Filled 2017-04-16 (×8): qty 1

## 2017-04-16 MED ORDER — LACTATED RINGERS IV SOLN
INTRAVENOUS | Status: DC
  Administered 2017-04-16: 17:00:00 via INTRAVENOUS

## 2017-04-16 MED ORDER — SIMETHICONE 80 MG PO CHEW: 80.0000 mg | CHEWABLE_TABLET | ORAL | Status: DC | PRN

## 2017-04-16 MED ORDER — DIBUCAINE 1 % RE OINT
1.0000 "application " | TOPICAL_OINTMENT | RECTAL | Status: DC | PRN
  Administered 2017-04-17: 1 via RECTAL
  Filled 2017-04-16: qty 28

## 2017-04-16 MED ORDER — OXYCODONE HCL 5 MG PO TABS: 10.0000 mg | ORAL_TABLET | ORAL | Status: DC | PRN

## 2017-04-16 MED ORDER — SENNOSIDES-DOCUSATE SODIUM 8.6-50 MG PO TABS: 2.0000 | ORAL_TABLET | ORAL | Status: DC

## 2017-04-16 MED ORDER — OXYCODONE HCL 5 MG PO TABS: 5.0000 mg | ORAL_TABLET | ORAL | Status: DC | PRN

## 2017-04-16 MED ORDER — ONDANSETRON HCL 4 MG PO TABS: 4.0000 mg | ORAL_TABLET | ORAL | Status: DC | PRN

## 2017-04-16 MED ORDER — ACETAMINOPHEN 325 MG PO TABS
650.0000 mg | ORAL_TABLET | ORAL | Status: DC | PRN
  Administered 2017-04-17: 650 mg via ORAL
  Filled 2017-04-16: qty 2

## 2017-04-16 MED ORDER — ONDANSETRON HCL 4 MG/2ML IJ SOLN: 4.0000 mg | INTRAMUSCULAR | Status: DC | PRN

## 2017-04-16 MED ORDER — PRENATAL MULTIVITAMIN CH
1.0000 | ORAL_TABLET | Freq: Every day | ORAL | Status: DC
  Administered 2017-04-17 – 2017-04-18 (×2): 1 via ORAL
  Filled 2017-04-16 (×2): qty 1

## 2017-04-16 MED ORDER — BENZOCAINE-MENTHOL 20-0.5 % EX AERO
1.0000 "application " | INHALATION_SPRAY | CUTANEOUS | Status: DC | PRN
  Administered 2017-04-16: 1 via TOPICAL

## 2017-04-16 MED ORDER — DIPHENHYDRAMINE HCL 25 MG PO CAPS: 25.0000 mg | ORAL_CAPSULE | Freq: Four times a day (QID) | ORAL | Status: DC | PRN

## 2017-04-16 MED ORDER — WITCH HAZEL-GLYCERIN EX PADS
1.0000 | MEDICATED_PAD | CUTANEOUS | Status: DC | PRN
Start: 2017-04-16 — End: 2017-04-18
  Administered 2017-04-17: 1 via TOPICAL
  Filled 2017-04-16: qty 100

## 2017-04-16 MED ORDER — ZOLPIDEM TARTRATE 5 MG PO TABS: 5.0000 mg | ORAL_TABLET | Freq: Every evening | ORAL | Status: DC | PRN

## 2017-04-16 MED ORDER — MISOPROSTOL 200 MCG PO TABS
ORAL_TABLET | ORAL | Status: AC
  Filled 2017-04-16: qty 4

## 2017-04-16 MED ORDER — COCONUT OIL OIL: 1.0000 "application " | TOPICAL_OIL | Status: DC | PRN

## 2017-04-16 MED ORDER — AMMONIA AROMATIC IN INHA
RESPIRATORY_TRACT | Status: AC
  Filled 2017-04-16: qty 10

## 2017-04-16 MED ORDER — OXYTOCIN 10 UNIT/ML IJ SOLN
INTRAMUSCULAR | Status: AC
  Filled 2017-04-16: qty 2

## 2017-04-16 MED ORDER — LIDOCAINE HCL (PF) 1 % IJ SOLN
INTRAMUSCULAR | Status: AC
  Filled 2017-04-16: qty 30

## 2017-04-16 MED ORDER — DOCUSATE SODIUM 100 MG PO CAPS
100.0000 mg | ORAL_CAPSULE | Freq: Two times a day (BID) | ORAL | Status: DC
  Administered 2017-04-16 – 2017-04-18 (×4): 100 mg via ORAL
  Filled 2017-04-16 (×4): qty 1

## 2017-04-16 NOTE — Lactation Note (Signed)
This note was copied from a baby's chart. Lactation Consultation Note  Patient Name: Sandra Cardenas Today's Date: 04/16/2017   Follow up: temp increased to 97.5 15 minutes after 97.3 was taken.   Maternal Data    Feeding    LATCH Score/Interventions Latch: Too sleepy or reluctant, no latch achieved, no sucking elicited. (too sleepy to feed; skin to skin/ drops of colostrum in mout)                    Lactation Tools Discussed/Used     Consult Status      Sunday CornSandra Clark Mareta Chesnut 04/16/2017, 4:16 PM

## 2017-04-16 NOTE — Progress Notes (Signed)
Sandra Cardenas is a 31 y.o. G1P0 at 5172w1d by LMP admitted for induction of labor due to Post dates.   Subjective: Rates pain a 7 on pain scale, breathing through them well and states IV pain meds help  Objective: BP (!) 99/51   Pulse (!) 58   Temp 98 F (36.7 C) (Oral)   Resp 17   Ht 5\' 3"  (1.6 m)   Wt 153 lb (69.4 kg)   LMP 07/02/2016   BMI 27.10 kg/m  No intake/output data recorded. No intake/output data recorded.  FHT:  FHR: 135 bpm, variability: moderate,  accelerations:  Present,  decelerations:  Present variable and occassional late, resolves with position changes UC:   irregular, every 2-4 minutes, with coupling noted. Moderate to palpation SVE:   Dilation: 4 Effacement (%): 70 Station: -1 Exam by:: Galen ManilaM. Kasondra Junod CNM IUPC & FSE placed after AROM-small amount clear fluid noted  Labs: Lab Results  Component Value Date   WBC 13.2 (H) 04/15/2017   HGB 11.1 (L) 04/15/2017   HCT 32.2 (L) 04/15/2017   MCV 91.1 04/15/2017   PLT 262 04/15/2017    Assessment / Plan: IOL progressing well on one dose of 50mcg cytotec placed last night, will add pitocin if needed.  Labor: Progressing normally Preeclampsia:  labs stable Fetal Wellbeing:  Category II, amnioinfusion ordered if fetal tracing continues with decelerations. Pain Control:  IV pain meds I/D:  n/a Anticipated MOD:  NSVD  Reygan Heagle N Aura CampsShambley, CNM 04/16/2017, 8:05 AM

## 2017-04-16 NOTE — Lactation Note (Signed)
This note was copied from a baby's chart. Lactation Consultation Note  Patient Name: Sandra Cardenas ZOXWR'UToday's Date: 04/16/2017  Sleepy baby had a blood sugar of 39 and temp of 97.3. Trying to find balance of feeding while keeping temp up. Baby skin to skin with Mom with warm blanket on baby's back and head covered. Mom hand expressing drops of colostrum into baby's mouth as she won't wake to feed now. Mom states baby was rooting and acting hungry while she was in L&D but that Mom was feeling dizzy and had low blood pressure, so couldn't feed her then.  Baby is relaxed now. No signs of being jittery. Report given  to RN Enrique SackKendra   Maternal Data    Feeding    LATCH Score/Interventions Latch: Too sleepy or reluctant, no latch achieved, no sucking elicited. (too sleepy to feed; skin to skin/ drops of colostrum in mout)                    Lactation Tools Discussed/Used     Consult Status      Sunday CornSandra Cardenas Sandra Cardenas 04/16/2017, 4:12 PM

## 2017-04-17 LAB — CBC
HEMATOCRIT: 24.5 % — AB (ref 35.0–47.0)
HEMOGLOBIN: 8.4 g/dL — AB (ref 12.0–16.0)
MCH: 31.1 pg (ref 26.0–34.0)
MCHC: 34 g/dL (ref 32.0–36.0)
MCV: 91.5 fL (ref 80.0–100.0)
Platelets: 209 10*3/uL (ref 150–440)
RBC: 2.68 MIL/uL — ABNORMAL LOW (ref 3.80–5.20)
RDW: 14.9 % — ABNORMAL HIGH (ref 11.5–14.5)
WBC: 15.7 10*3/uL — ABNORMAL HIGH (ref 3.6–11.0)

## 2017-04-17 LAB — RPR: RPR: NONREACTIVE

## 2017-04-17 NOTE — Progress Notes (Signed)
Post Partum Day 1 Subjective: up ad lib, voiding and has had 2 near fainting spells, but states she feels better this am.  Objective: Blood pressure 109/74, pulse 99, temperature 97.3 F (36.3 C), temperature source Axillary, resp. rate 18, height 5\' 3"  (1.6 m), weight 153 lb (69.4 kg), last menstrual period 07/02/2016, SpO2 100 %, unknown if currently breastfeeding.  Physical Exam:  General: alert, cooperative and appears stated age Lochia: appropriate Uterine Fundus: firm Incision: NA DVT Evaluation: No evidence of DVT seen on physical exam. Negative Homan's sign.   Recent Labs  04/15/17 2115 04/17/17 0531  HGB 11.1* 8.4*  HCT 32.2* 24.5*    Assessment/Plan: Plan for discharge tomorrow and Breastfeeding   LOS: 2 days   Sandra Cardenas N Tyquez Hollibaugh 04/17/2017, 9:33 AM

## 2017-04-17 NOTE — Lactation Note (Signed)
This note was copied from a baby's chart. Lactation Consultation Note  Patient Name: Sandra Cardenas Today's Date: 04/17/2017  Mom reports that breastfeeding has been going "much better" since yesterday. She has been wearing breast shells and her nipples are more everted today. No nipple trauma seen. Adequate diaper count. Nice improvements!   Maternal Data    Feeding Feeding Type: Breast Fed Length of feed: 20 min (per mom)  LATCH Score/Interventions                      Lactation Tools Discussed/Used     Consult Status      Sunday CornSandra Clark [name] 04/17/2017, 5:42 PM

## 2017-04-18 MED ORDER — FUSION PLUS PO CAPS: 1.0000 | ORAL_CAPSULE | Freq: Every day | ORAL | 1 refills | Status: DC

## 2017-04-18 NOTE — Progress Notes (Signed)
Patient discharged home with infant and significant other. Discharge instructions, prescriptions and follow up appointment given to and reviewed with patient and significant other. Patient verbalized understanding. Escorted out via wheelchair by auxiliary.  

## 2017-04-18 NOTE — Discharge Summary (Signed)
Physician Obstetric Discharge Summary  Patient ID: Sandra Cardenas MRN: [id number] DOB/AGE: 32/03/1986 32 y.o.   Date of Admission: 04/15/2017  Date of Discharge:   Admitting Diagnosis: Induction of labor at 4220w1d  Secondary Diagnosis: Anemia in pregnancy  Mode of Delivery: normal spontaneous vaginal delivery     Discharge Diagnosis: SVD   Intrapartum Procedures: Atificial rupture of membranes and laceration 2nd   Post partum procedures: none  Complications: 2nd degree perineal laceration and anemia   Brief Hospital Course  Sandra Cardenas is a G1P1001 who had a SVD on 04/15/17;  for further details of this, please refer to the delivey note.  Patient had an uncomplicated postpartum course.  By time of discharge on PPD#2, her pain was controlled on oral pain medications; she had appropriate lochia and was ambulating, voiding without difficulty and tolerating regular diet.  She was deemed stable for discharge to home.       Labs: CBC Latest Ref Rng & Units 04/17/2017 04/15/2017 01/11/2017  WBC 3.6 - 11.0 K/uL 15.7(H) 13.2(H) -  Hemoglobin 12.0 - 16.0 g/dL 4.0(J8.4(L) 11.1(L) -  Hematocrit 35.0 - 47.0 % 24.5(L) 32.2(L) 32.8(L)  Platelets 150 - 440 K/uL 209 262 -   O POS  Physical exam:  Blood pressure 120/75, pulse 90, temperature (!) 96.2 F (35.7 C), temperature source Axillary, resp. rate 20, height 5\' 3"  (1.6 m), weight 153 lb (69.4 kg), last menstrual period 07/02/2016, SpO2 99 %, unknown if currently breastfeeding. General: alert and no distress Lochia: appropriate Abdomen: soft, NT Uterine Fundus: firm Extremities: No evidence of DVT seen on physical exam. Mild non-pitting lower extremity edema.  Discharge Instructions: Per After Visit Summary. Activity: Advance as tolerated. Pelvic rest for 6 weeks.  Also refer to After Visit Summary Diet: Regular Medications:  Outpatient follow up:  Follow-up Information    SpurgeonShambley, Melodye PedMelody N, CNM. Schedule an appointment as soon as possible  for a visit in 6 week(s).   Specialties:  Obstetrics and Gynecology, Radiology Why:  Please call to make your 6 week postpartum follow up appointment  Contact information: [address] [place] [phone]          Postpartum contraception: condoms  Discharged Condition: good  Discharged to: home   Newborn Data: Disposition:home with mother "Sandra Cardenas"  Apgars: APGAR (1 MIN): 8   APGAR (5 MINS): 10   APGAR (10 MINS):    Baby Feeding: Breast  Dennis Killilea Suzan NailerN Deshara Rossi, CNM

## 2017-04-18 NOTE — Discharge Instructions (Signed)
Please call your doctor or return to the ER if you experience any chest pains, shortness of breath, dizziness, visual changes, fever greater than 101, any heavy bleeding (saturating more than 1 pad per hour), large clots, or foul smelling discharge, any worsening abdominal pain and cramping that is not controlled by pain medication, or any signs of postpartum depression. No tampons, enemas, douches, or sexual intercourse for 6 weeks. Also avoid tub baths, hot tubs, or swimming for 6 weeks.  °

## 2017-05-04 ENCOUNTER — Observation Stay
Admission: EM | Admit: 2017-05-04 | Discharge: 2017-05-04 | Disposition: A | Discharge status: Home / Self Care | Payer: BC Managed Care – PPO | Attending: Surgery | Admitting: Surgery

## 2017-05-04 ENCOUNTER — Encounter: Payer: Self-pay | Admitting: Emergency Medicine

## 2017-05-04 ENCOUNTER — Observation Stay: Payer: BC Managed Care – PPO | Admitting: Anesthesiology

## 2017-05-04 ENCOUNTER — Emergency Department: Payer: BC Managed Care – PPO

## 2017-05-04 ENCOUNTER — Encounter
Admission: EM | Disposition: A | Discharge status: Home / Self Care | Payer: Self-pay | Source: Home / Self Care | Attending: Emergency Medicine

## 2017-05-04 DIAGNOSIS — O9963 Diseases of the digestive system complicating the puerperium: Principal | ICD-10-CM | POA: Insufficient documentation

## 2017-05-04 DIAGNOSIS — K8010 Calculus of gallbladder with chronic cholecystitis without obstruction: Secondary | ICD-10-CM | POA: Insufficient documentation

## 2017-05-04 DIAGNOSIS — Z880 Allergy status to penicillin: Secondary | ICD-10-CM | POA: Insufficient documentation

## 2017-05-04 DIAGNOSIS — Z800 Family history of malignant neoplasm of digestive organs: Secondary | ICD-10-CM | POA: Insufficient documentation

## 2017-05-04 DIAGNOSIS — Z806 Family history of leukemia: Secondary | ICD-10-CM | POA: Diagnosis not present

## 2017-05-04 DIAGNOSIS — K8000 Calculus of gallbladder with acute cholecystitis without obstruction: Secondary | ICD-10-CM

## 2017-05-04 DIAGNOSIS — K810 Acute cholecystitis: Secondary | ICD-10-CM

## 2017-05-04 DIAGNOSIS — Z8379 Family history of other diseases of the digestive system: Secondary | ICD-10-CM | POA: Insufficient documentation

## 2017-05-04 DIAGNOSIS — K819 Cholecystitis, unspecified: Secondary | ICD-10-CM

## 2017-05-04 DIAGNOSIS — Z833 Family history of diabetes mellitus: Secondary | ICD-10-CM | POA: Diagnosis not present

## 2017-05-04 HISTORY — PX: CHOLECYSTECTOMY: SHX55

## 2017-05-04 LAB — COMPREHENSIVE METABOLIC PANEL
ALT: 15 U/L (ref 14–54)
ANION GAP: 7 (ref 5–15)
AST: 18 U/L (ref 15–41)
Albumin: 3.8 g/dL (ref 3.5–5.0)
Alkaline Phosphatase: 96 U/L (ref 38–126)
BILIRUBIN TOTAL: 0.5 mg/dL (ref 0.3–1.2)
BUN: 13 mg/dL (ref 6–20)
CHLORIDE: 105 mmol/L (ref 101–111)
CO2: 26 mmol/L (ref 22–32)
Calcium: 9.4 mg/dL (ref 8.9–10.3)
Creatinine, Ser: 0.71 mg/dL (ref 0.44–1.00)
Glucose, Bld: 104 mg/dL — ABNORMAL HIGH (ref 65–99)
POTASSIUM: 3.9 mmol/L (ref 3.5–5.1)
Sodium: 138 mmol/L (ref 135–145)
TOTAL PROTEIN: 7 g/dL (ref 6.5–8.1)

## 2017-05-04 LAB — CBC
HEMATOCRIT: 33.9 % — AB (ref 35.0–47.0)
Hemoglobin: 11.6 g/dL — ABNORMAL LOW (ref 12.0–16.0)
MCH: 32.8 pg (ref 26.0–34.0)
MCHC: 34.2 g/dL (ref 32.0–36.0)
MCV: 95.9 fL (ref 80.0–100.0)
PLATELETS: 374 10*3/uL (ref 150–440)
RBC: 3.54 MIL/uL — ABNORMAL LOW (ref 3.80–5.20)
RDW: 14.6 % — AB (ref 11.5–14.5)
WBC: 11.1 10*3/uL — ABNORMAL HIGH (ref 3.6–11.0)

## 2017-05-04 LAB — LIPASE, BLOOD: LIPASE: 30 U/L (ref 11–51)

## 2017-05-04 LAB — SURGICAL PCR SCREEN
MRSA, PCR: NEGATIVE
Staphylococcus aureus: POSITIVE — AB

## 2017-05-04 SURGERY — LAPAROSCOPIC CHOLECYSTECTOMY
Anesthesia: General | Wound class: Clean Contaminated

## 2017-05-04 MED ORDER — MEPERIDINE HCL 50 MG/ML IJ SOLN: 6.2500 mg | INTRAMUSCULAR | Status: DC | PRN

## 2017-05-04 MED ORDER — MIDAZOLAM HCL 2 MG/2ML IJ SOLN
INTRAMUSCULAR | Status: DC | PRN
  Administered 2017-05-04: 2 mg via INTRAVENOUS

## 2017-05-04 MED ORDER — OXYCODONE HCL 5 MG PO TABS: 5.0000 mg | ORAL_TABLET | Freq: Once | ORAL | 0 refills | Status: DC | PRN

## 2017-05-04 MED ORDER — SUGAMMADEX SODIUM 200 MG/2ML IV SOLN
INTRAVENOUS | Status: AC
  Filled 2017-05-04: qty 2

## 2017-05-04 MED ORDER — IBUPROFEN 600 MG PO TABS
600.0000 mg | ORAL_TABLET | Freq: Four times a day (QID) | ORAL | Status: DC | PRN
  Filled 2017-05-04: qty 1

## 2017-05-04 MED ORDER — MORPHINE SULFATE (PF) 2 MG/ML IV SOLN: 2.0000 mg | INTRAVENOUS | Status: DC | PRN

## 2017-05-04 MED ORDER — PHENYLEPHRINE HCL 10 MG/ML IJ SOLN
INTRAMUSCULAR | Status: DC | PRN
  Administered 2017-05-04: 100 ug via INTRAVENOUS

## 2017-05-04 MED ORDER — OXYCODONE HCL 5 MG PO TABS: 5.0000 mg | ORAL_TABLET | Freq: Four times a day (QID) | ORAL | 0 refills | Status: DC | PRN

## 2017-05-04 MED ORDER — FENTANYL CITRATE (PF) 100 MCG/2ML IJ SOLN
25.0000 ug | INTRAMUSCULAR | Status: DC | PRN
  Administered 2017-05-04: 25 ug via INTRAVENOUS

## 2017-05-04 MED ORDER — LACTATED RINGERS IV SOLN
INTRAVENOUS | Status: DC | PRN
  Administered 2017-05-04: 10:00:00 via INTRAVENOUS

## 2017-05-04 MED ORDER — OXYCODONE-ACETAMINOPHEN 5-325 MG PO TABS
1.0000 | ORAL_TABLET | ORAL | Status: DC | PRN
  Administered 2017-05-04: 1 via ORAL
  Filled 2017-05-04: qty 1

## 2017-05-04 MED ORDER — LIDOCAINE HCL 1 % IJ SOLN
INTRAMUSCULAR | Status: DC | PRN
  Administered 2017-05-04: 15 mL via INTRAMUSCULAR

## 2017-05-04 MED ORDER — OXYCODONE HCL 5 MG PO TABS: 5.0000 mg | ORAL_TABLET | Freq: Once | ORAL | Status: DC | PRN

## 2017-05-04 MED ORDER — HEPARIN SODIUM (PORCINE) 5000 UNIT/ML IJ SOLN: 5000.0000 [IU] | Freq: Three times a day (TID) | INTRAMUSCULAR | Status: DC

## 2017-05-04 MED ORDER — ONDANSETRON 4 MG PO TBDP: 4.0000 mg | ORAL_TABLET | Freq: Four times a day (QID) | ORAL | Status: DC | PRN

## 2017-05-04 MED ORDER — ONDANSETRON HCL 4 MG/2ML IJ SOLN: 4.0000 mg | Freq: Four times a day (QID) | INTRAMUSCULAR | Status: DC | PRN

## 2017-05-04 MED ORDER — ACETAMINOPHEN 10 MG/ML IV SOLN
INTRAVENOUS | Status: AC
  Filled 2017-05-04: qty 100

## 2017-05-04 MED ORDER — MORPHINE SULFATE (PF) 4 MG/ML IV SOLN
INTRAVENOUS | Status: AC
  Administered 2017-05-04: 4 mg
  Filled 2017-05-04: qty 1

## 2017-05-04 MED ORDER — FENTANYL CITRATE (PF) 100 MCG/2ML IJ SOLN
INTRAMUSCULAR | Status: AC
  Filled 2017-05-04: qty 2

## 2017-05-04 MED ORDER — FLUTICASONE PROPIONATE 50 MCG/ACT NA SUSP
1.0000 | Freq: Every day | NASAL | Status: DC
  Filled 2017-05-04: qty 16

## 2017-05-04 MED ORDER — PROMETHAZINE HCL 25 MG/ML IJ SOLN: 6.2500 mg | INTRAMUSCULAR | Status: DC | PRN

## 2017-05-04 MED ORDER — MIDAZOLAM HCL 2 MG/2ML IJ SOLN
INTRAMUSCULAR | Status: AC
  Filled 2017-05-04: qty 2

## 2017-05-04 MED ORDER — SUGAMMADEX SODIUM 200 MG/2ML IV SOLN
INTRAVENOUS | Status: DC | PRN
  Administered 2017-05-04: 200 mg via INTRAVENOUS

## 2017-05-04 MED ORDER — ROCURONIUM BROMIDE 100 MG/10ML IV SOLN
INTRAVENOUS | Status: DC | PRN
  Administered 2017-05-04: 50 mg via INTRAVENOUS

## 2017-05-04 MED ORDER — ONDANSETRON HCL 4 MG/2ML IJ SOLN: 4.0000 mg | Freq: Once | INTRAMUSCULAR | Status: DC

## 2017-05-04 MED ORDER — MORPHINE SULFATE (PF) 4 MG/ML IV SOLN: 4.0000 mg | Freq: Once | INTRAVENOUS | Status: DC

## 2017-05-04 MED ORDER — ONDANSETRON HCL 4 MG/2ML IJ SOLN
INTRAMUSCULAR | Status: DC | PRN
  Administered 2017-05-04: 4 mg via INTRAVENOUS

## 2017-05-04 MED ORDER — ONDANSETRON HCL 4 MG/2ML IJ SOLN
4.0000 mg | Freq: Once | INTRAMUSCULAR | Status: AC
  Administered 2017-05-04: 4 mg via INTRAVENOUS
  Filled 2017-05-04: qty 2

## 2017-05-04 MED ORDER — PROPOFOL 10 MG/ML IV BOLUS
INTRAVENOUS | Status: AC
  Filled 2017-05-04: qty 20

## 2017-05-04 MED ORDER — ACETAMINOPHEN 10 MG/ML IV SOLN
INTRAVENOUS | Status: DC | PRN
  Administered 2017-05-04: 1000 mg via INTRAVENOUS

## 2017-05-04 MED ORDER — DEXTROSE-NACL 5-0.9 % IV SOLN: INTRAVENOUS | Status: DC

## 2017-05-04 MED ORDER — OXYCODONE-ACETAMINOPHEN 5-325 MG PO TABS: 1.0000 | ORAL_TABLET | Freq: Four times a day (QID) | ORAL | 0 refills | Status: DC | PRN

## 2017-05-04 MED ORDER — DEXAMETHASONE SODIUM PHOSPHATE 10 MG/ML IJ SOLN
INTRAMUSCULAR | Status: DC | PRN
  Administered 2017-05-04: 10 mg via INTRAVENOUS

## 2017-05-04 MED ORDER — ACETAMINOPHEN 650 MG RE SUPP: 650.0000 mg | Freq: Four times a day (QID) | RECTAL | Status: DC | PRN

## 2017-05-04 MED ORDER — MORPHINE SULFATE (PF) 4 MG/ML IV SOLN
4.0000 mg | Freq: Once | INTRAVENOUS | Status: AC
  Administered 2017-05-04: 4 mg via INTRAVENOUS
  Filled 2017-05-04: qty 1

## 2017-05-04 MED ORDER — ACETAMINOPHEN 325 MG PO TABS
650.0000 mg | ORAL_TABLET | Freq: Four times a day (QID) | ORAL | Status: DC | PRN
  Administered 2017-05-04: 650 mg via ORAL

## 2017-05-04 MED ORDER — CIPROFLOXACIN IN D5W 400 MG/200ML IV SOLN
400.0000 mg | Freq: Two times a day (BID) | INTRAVENOUS | Status: DC
  Filled 2017-05-04 (×2): qty 200

## 2017-05-04 MED ORDER — SODIUM CHLORIDE 0.9 % IV BOLUS (SEPSIS)
1000.0000 mL | Freq: Once | INTRAVENOUS | Status: AC
  Administered 2017-05-04: 1000 mL via INTRAVENOUS

## 2017-05-04 MED ORDER — HYDROMORPHONE HCL 1 MG/ML IJ SOLN: 0.5000 mg | INTRAMUSCULAR | Status: DC | PRN

## 2017-05-04 MED ORDER — CLINDAMYCIN PHOSPHATE 900 MG/50ML IV SOLN
INTRAVENOUS | Status: DC | PRN
  Administered 2017-05-04: 900 mg via INTRAVENOUS

## 2017-05-04 MED ORDER — LIDOCAINE HCL (CARDIAC) 20 MG/ML IV SOLN
INTRAVENOUS | Status: DC | PRN
  Administered 2017-05-04: 80 mg via INTRAVENOUS

## 2017-05-04 MED ORDER — PROPOFOL 10 MG/ML IV BOLUS
INTRAVENOUS | Status: DC | PRN
  Administered 2017-05-04: 140 mg via INTRAVENOUS

## 2017-05-04 MED ORDER — FENTANYL CITRATE (PF) 100 MCG/2ML IJ SOLN
INTRAMUSCULAR | Status: DC | PRN
  Administered 2017-05-04: 100 ug via INTRAVENOUS

## 2017-05-04 MED ORDER — OXYCODONE HCL 5 MG/5ML PO SOLN: 5.0000 mg | Freq: Once | ORAL | Status: DC | PRN

## 2017-05-04 MED ORDER — ONDANSETRON HCL 4 MG PO TABS: 4.0000 mg | ORAL_TABLET | Freq: Four times a day (QID) | ORAL | Status: DC | PRN

## 2017-05-04 SURGICAL SUPPLY — 37 items
APPLIER CLIP LAPSCP 10X32 DD (CLIP) ×3 IMPLANT
BAG RETRIEVAL 10 (BASKET) ×1
BAG RETRIEVAL 10MM (BASKET) ×1
CHLORAPREP W/TINT 26ML (MISCELLANEOUS) ×3 IMPLANT
DECANTER SPIKE VIAL GLASS SM (MISCELLANEOUS) IMPLANT
DERMABOND ADVANCED (GAUZE/BANDAGES/DRESSINGS) ×2
DERMABOND ADVANCED .7 DNX12 (GAUZE/BANDAGES/DRESSINGS) ×1 IMPLANT
DEVICE PMI PUNCTURE CLOSURE (MISCELLANEOUS) ×3 IMPLANT
DRESSING SURGICEL FIBRLLR 1X2 (HEMOSTASIS) ×1 IMPLANT
DRSG SURGICEL FIBRILLAR 1X2 (HEMOSTASIS) ×3
ELECT REM PT RETURN 9FT ADLT (ELECTROSURGICAL) ×3
ELECTRODE REM PT RTRN 9FT ADLT (ELECTROSURGICAL) ×1 IMPLANT
GLOVE BIOGEL PI IND STRL 7.0 (GLOVE) ×1 IMPLANT
GLOVE BIOGEL PI IND STRL 7.5 (GLOVE) ×1 IMPLANT
GLOVE BIOGEL PI INDICATOR 7.0 (GLOVE) ×2
GLOVE BIOGEL PI INDICATOR 7.5 (GLOVE) ×2
GOWN STRL REUS W/ TWL LRG LVL3 (GOWN DISPOSABLE) ×3 IMPLANT
GOWN STRL REUS W/TWL LRG LVL3 (GOWN DISPOSABLE) ×6
IRRIGATION STRYKERFLOW (MISCELLANEOUS) ×1 IMPLANT
IRRIGATOR STRYKERFLOW (MISCELLANEOUS) ×3
IV NS 1000ML (IV SOLUTION) ×2
IV NS 1000ML BAXH (IV SOLUTION) ×1 IMPLANT
KIT RM TURNOVER STRD PROC AR (KITS) ×3 IMPLANT
NEEDLE HYPO 22GX1.5 SAFETY (NEEDLE) ×3 IMPLANT
NEEDLE INSUFFLATION 14GA 120MM (NEEDLE) ×3 IMPLANT
NS IRRIG 1000ML POUR BTL (IV SOLUTION) ×3 IMPLANT
PACK LAP CHOLECYSTECTOMY (MISCELLANEOUS) ×3 IMPLANT
SCISSORS METZENBAUM CVD 33 (INSTRUMENTS) IMPLANT
SLEEVE ENDOPATH XCEL 5M (ENDOMECHANICALS) ×6 IMPLANT
SUT MNCRL AB 4-0 PS2 18 (SUTURE) ×3 IMPLANT
SUT VICRYL 0 UR6 27IN ABS (SUTURE) ×3 IMPLANT
SUT VICRYL AB 3-0 FS1 BRD 27IN (SUTURE) ×3 IMPLANT
SYS BAG RETRIEVAL 10MM (BASKET) ×1
SYSTEM BAG RETRIEVAL 10MM (BASKET) ×1 IMPLANT
TROCAR XCEL NON-BLD 11X100MML (ENDOMECHANICALS) ×3 IMPLANT
TROCAR XCEL NON-BLD 5MMX100MML (ENDOMECHANICALS) ×3 IMPLANT
TUBING INSUFFLATION (TUBING) ×3 IMPLANT

## 2017-05-04 NOTE — Discharge Instructions (Signed)
In addition to included general post-operative instructions for Laparoscopic Cholecystectomy,  Diet: Resume home heart healthy diet.   Activity: No heavy lifting >20 pounds (children, pets, laundry, garbage) or strenuous activity until follow-up, but light activity and walking are encouraged. Do not drive or drink alcohol if taking narcotic pain medications.  Wound care: 2 days after surgery (Monday, 6/11), may shower/get incision wet with soapy water and pat dry (do not rub incisions), but no baths or submerging incision underwater until follow-up.  Breastfeeding: Continue to pump breast milk to stimulate production / maintain supply, but discard breast milk for 24 hours after general anesthesia before resuming breastfeeding.  Medications: Resume all home medications. For mild to moderate pain: acetaminophen (Tylenol) or ibuprofen (if no kidney disease). Combining Tylenol with alcohol can substantially increase your risk of causing liver disease. Narcotic pain medications, if prescribed, can be used for severe pain, though may cause nausea, constipation, and drowsiness. Do not combine Tylenol and Percocet within a 6 hour period as Percocet contains Tylenol. If you do not need the narcotic pain medication, you do not need to fill the prescription.  Call office [phone]) at any time if any questions, worsening pain, fevers/chills, bleeding, drainage from incision site, or other concerns.

## 2017-05-04 NOTE — Progress Notes (Signed)
05/04/2017  4:26 PM  Sandra Cardenas to be D/C'd Home per MD order.  Discussed prescriptions and follow up appointments with the patient. Prescriptions given to patient, medication list explained in detail. Pt verbalized understanding.  Allergies as of 05/04/2017      Reactions   Penicillins       Medication List    TAKE these medications   FUSION PLUS Caps Take 1 capsule by mouth daily.   oxyCODONE 5 MG immediate release tablet Commonly known as:  Oxy IR/ROXICODONE Take 1 tablet (5 mg total) by mouth every 6 (six) hours as needed for severe pain.       Vitals:   05/04/17 1203 05/04/17 1230  BP: 111/78 111/74  Pulse: 63 63  Resp: 13 19  Temp: 98.3 F (36.8 C) 97.5 F (36.4 C)    Skin clean, dry and intact without evidence of skin break down, no evidence of skin tears noted. IV catheter discontinued intact. Site without signs and symptoms of complications. Dressing and pressure applied. Pt denies pain at this time. No complaints noted.  An After Visit Summary was printed and given to the patient. Patient escorted via WC, and D/C home via private auto.  Bradly Chrisougherty, Teon Hudnall E

## 2017-05-04 NOTE — Discharge Summary (Signed)
Physician Discharge Summary  Patient ID: Sandra LoganMaria Cardenas MRN: [id number] DOB/AGE: 32/03/1986 32 y.o.  Admit date: 05/04/2017 Discharge date: 05/04/2017  Admission Diagnoses:  Discharge Diagnoses:  Active Problems:   Acute cholecystitis   Discharged Condition: good  Hospital Course: 31 year old Female, 2 weeks post-partum, [facility] with post-prandial RUQ abdominal pain that did not resolve as had similar less severe episodes of RUQ post-prandial abdominal pain. Ultrasound demonstrated acute cholecystitis, and informed consent for laparoscopic cholecystectomy was obtained. Patient underwent uneventful laparoscopic cholecystectomy. Post-operatively, patient tolerated advancement of her diet and ambulation with pain well-controlled, and her post-operative course was otherwise unremarkable. Discharge planning was initiated accordingly, and patient was safely able to be discharged home to her new baby with appropriate discharge instructions and outpatient follow-up.  Consults: None  Significant Diagnostic Studies: radiology: Ultrasound: acute cholecystitis  Treatments: surgery: laparoscopic cholecystectomy  Discharge Exam: Blood pressure 111/74, pulse 63, temperature 97.5 F (36.4 C), temperature source Oral, resp. rate 19, height 5\' 3"  (1.6 m), weight 146 lb (66.2 kg), SpO2 100 %, unknown if currently breastfeeding. General appearance: alert, cooperative, appears stated age and no distress GI: abdomen soft and ND with mild epigastric peri-incisional abdominal tenderness to palpation with incisions well-approximated without erythema or drainage  Disposition: 01-Home or Self Care   Allergies as of 05/04/2017      Reactions   Penicillins       Medication List    TAKE these medications   FUSION PLUS Caps Take 1 capsule by mouth daily.   oxyCODONE 5 MG immediate release tablet Commonly known as:  Oxy IR/ROXICODONE Take 1 tablet (5 mg total) by mouth every 6 (six) hours as needed  for severe pain.      Follow-up Information    [facility]. Schedule an appointment as soon as possible for a visit in 2 week(s).   Specialty:  General Surgery Contact information: [address] [place] [id number] [phone]          Signed: Ancil LinseyJason Evan Davis 05/04/2017, 3:39 PM

## 2017-05-04 NOTE — ED Triage Notes (Signed)
Patient with complaint of generalized abdominal pain that radiates to her back times hour. Patient also states that she has been vomiting. Patient is 2 weeks postpartum patient had a vaginal delivery with no complications.

## 2017-05-04 NOTE — Anesthesia Post-op Follow-up Note (Cosign Needed)
Anesthesia QCDR form completed.        

## 2017-05-04 NOTE — Anesthesia Preprocedure Evaluation (Addendum)
Anesthesia Evaluation  Patient identified by MRN, date of birth, ID band Patient awake    Reviewed: Allergy & Precautions, NPO status , Patient's Chart, lab work & pertinent test results  History of Anesthesia Complications Negative for: history of anesthetic complications  Airway Mallampati: II  TM Distance: >3 FB Neck ROM: Full    Dental no notable dental hx.    Pulmonary neg pulmonary ROS, neg sleep apnea, neg COPD,    breath sounds clear to auscultation- rhonchi (-) wheezing      Cardiovascular Exercise Tolerance: Good (-) hypertension(-) CAD and (-) Past MI  Rhythm:Regular Rate:Normal - Systolic murmurs and - Diastolic murmurs    Neuro/Psych negative neurological ROS  negative psych ROS   GI/Hepatic negative GI ROS, Neg liver ROS,   Endo/Other  negative endocrine ROSneg diabetes  Renal/GU negative Renal ROS     Musculoskeletal negative musculoskeletal ROS (+)   Abdominal (+) - obese,   Peds  Hematology negative hematology ROS (+)   Anesthesia Other Findings   Reproductive/Obstetrics (+) Breast feeding                              Anesthesia Physical Anesthesia Plan  ASA: I  Anesthesia Plan: General   Post-op Pain Management:    Induction: Intravenous  PONV Risk Score and Plan: 1 and Ondansetron, Midazolam, Dexamethasone and Propofol  Airway Management Planned: Oral ETT  Additional Equipment:   Intra-op Plan:   Post-operative Plan: Extubation in OR  Informed Consent: I have reviewed the patients History and Physical, chart, labs and discussed the procedure including the risks, benefits and alternatives for the proposed anesthesia with the patient or authorized representative who has indicated his/her understanding and acceptance.   Dental advisory given  Plan Discussed with: Anesthesiologist and CRNA  Anesthesia Plan Comments:       Lab Results  Component  Value Date   WBC 11.1 (H) 05/04/2017   HGB 11.6 (L) 05/04/2017   HCT 33.9 (L) 05/04/2017   MCV 95.9 05/04/2017   PLT 374 05/04/2017    Anesthesia Quick Evaluation

## 2017-05-04 NOTE — ED Notes (Signed)
Pt going to CT

## 2017-05-04 NOTE — ED Provider Notes (Signed)
[facility] Emergency Department Provider Note  Time seen: 3:48 AM  I have reviewed the triage vital signs and the nursing notes.   HISTORY  Chief Complaint Abdominal Pain and Emesis    HPI Sandra Cardenas is a 31 y.o. female 2 weeks postpartum NSVD, presents to the emergency department with right upper quadrant abdominal pain. According to the patient she was sleeping when she was awoken from her sleep with severe aching 9/10 pain in the right upper quadrant. Patient denies any history of gallbladder disease. Denies any previous pain. Denies any alcohol use. Describes her pain as 9/10 currently. Denies any fever. States positive for nausea and vomiting.  Past Medical History:  Diagnosis Date  . Frequent headaches     Patient Active Problem List   Diagnosis Date Noted  . Encounter for elective induction of labor 04/15/2017  . Left hip pain 10/01/2014    History reviewed. No pertinent surgical history.  Prior to Admission medications   Medication Sig Start Date End Date Taking? Authorizing Provider  Calcium Carb-Cholecalciferol (CALCIUM 600-D PO) Take by mouth.    [provider]  fluticasone (FLONASE) 50 MCG/ACT nasal spray Place 1 spray into both nostrils daily.    [provider]  Inulin (FIBER CHOICE PO) Take by mouth.    [provider]  Iron-FA-B Cmp-C-Biot-Probiotic (FUSION PLUS) CAPS Take 1 capsule by mouth daily. 04/18/17   Shambley, Melody N, CNM  loratadine (CLARITIN) 10 MG tablet Take 10 mg by mouth daily.    [provider]  Prenatal Vit-Fe Fumarate-FA (PRENATAL MULTIVITAMIN) TABS tablet Take 1 tablet by mouth daily at 12 noon.    [provider]    Allergies  Allergen Reactions  . Penicillins     Family History  Problem Relation Age of Onset  . Diabetes Paternal Grandmother   . Pancreatic cancer Paternal Grandmother   . Diabetes Paternal Grandfather   . Leukemia Maternal Grandmother   .  Breast cancer Neg Hx   . Ovarian cancer Neg Hx   . Colon cancer Neg Hx   . Heart disease Neg Hx     Social History Social History  Substance Use Topics  . Smoking status: Never Smoker  . Smokeless tobacco: Never Used  . Alcohol use No    Review of Systems Constitutional: Negative for fever. Cardiovascular: Negative for chest pain. Respiratory: Negative for shortness of breath. Gastrointestinal: Right upper quadrant abdominal pain. Positive for nausea and vomiting. Genitourinary: Negative for dysuria. Musculoskeletal: Negative for back pain. Skin: Negative for rash. Neurological: Negative for headache All other ROS negative  ____________________________________________   PHYSICAL EXAM:  VITAL SIGNS: ED Triage Vitals [05/04/17 0314]  Enc Vitals Group     BP 110/81     Pulse Rate 68     Resp 18     Temp 98 F (36.7 C)     Temp Source Oral     SpO2 100 %     Weight 146 lb (66.2 kg)     Height 5\' 3"  (1.6 m)     Head Circumference      Peak Flow      Pain Score 9     Pain Loc      Pain Edu?      Excl. in GC?     Constitutional: Alert and oriented. Well appearing and in no distress. Eyes: Normal exam ENT   Head: Normocephalic and atraumatic.   Mouth/Throat: Mucous membranes are moist. Cardiovascular: Normal rate, regular  rhythm. No murmur Respiratory: Normal respiratory effort without tachypnea nor retractions. Breath sounds are clear Gastrointestinal: Soft, moderate right upper quadrant tenderness to palpation. No rebound or guarding. No distention. Musculoskeletal: Nontender with normal range of motion in all extremities.  Neurologic:  Normal speech and language. No gross focal neurologic deficits Skin:  Skin is warm, dry and intact.  Psychiatric: Mood and affect are normal.   ____________________________________________   RADIOLOGY  1. Cholelithiasis with mild gallbladder wall thickening. This may reflect acute cholecystitis in the appropriate  clinical setting. Negative sonographic Murphy sign, however patient received morphine prior to the exam. 2. No biliary dilatation.  ____________________________________________   INITIAL IMPRESSION / ASSESSMENT AND PLAN / ED COURSE  Pertinent labs & imaging results that were available during my care of the patient were reviewed by me and considered in my medical decision making (see chart for details).  The patient presents to the emergency department for right upper quadrant abdominal pain which started approximately 1 hour prior to presentation associated with nausea and vomiting. Denies any recent food intake. Denies any history of right upper quadrant pain in the past. Patient's symptoms are very suggestive of biliary disease. Patient has moderate tenderness over the gallbladder area. We'll check labs, obtain an option and treat the patient's pain and nausea in the emergency department.  Ultrasound shows possible cholecystitis. Patient continues to have pain despite 2 rounds of pain medication. Patient will be admitted to general surgery for further workup and treatment.  ____________________________________________   FINAL CLINICAL IMPRESSION(S) / ED DIAGNOSES  Right upper quadrant abdominal pain Cholecystitis   Minna Antis, MD 05/04/17 [phone]

## 2017-05-04 NOTE — ED Notes (Signed)
Pt c/o of RUQ pain radiating to the back; pt states 6 episodes of emesis in the last 1 hour; pt denies any blood in the emesis; pt denies any diarrhea; pt states giving vaginal birth about 2 weeks ago with a tear; pt has some vaginal bleeding that stopped yesterday, no bleeding reported today;

## 2017-05-04 NOTE — ED Notes (Signed)
Pt's husband would like to be contacted with any updates on the patient; if the patient is moved to inpatient unit, please call him and let him know; his phone number is [phone]

## 2017-05-04 NOTE — H&P (Signed)
Sandra Cardenas is an 31 y.o. female.    Chief Complaint: Right upper quadrant pain  HPI: This a patient with the history of having the first and only episode of right upper quadrant pain with back pain and nausea and vomiting that started at 3 AM this morning. She's never had an episode like this before denies fevers or chills are noted no jaundice or acholic stools while her pain is slightly better than it had been it is still present and unrelenting. A workup which suggested early acute cholecystitis.  Her mother had gallstones  She is recently postpartum with a 38-week-old baby who is doing well. Following a normal spontaneous vaginal delivery she does not smoke or drink  Past Medical History:  Diagnosis Date  . Frequent headaches     History reviewed. No pertinent surgical history.  Family History  Problem Relation Age of Onset  . Diabetes Paternal Grandmother   . Pancreatic cancer Paternal Grandmother   . Diabetes Paternal Grandfather   . Leukemia Maternal Grandmother   . Breast cancer Neg Hx   . Ovarian cancer Neg Hx   . Colon cancer Neg Hx   . Heart disease Neg Hx    Social History:  reports that she has never smoked. She has never used smokeless tobacco. She reports that she does not drink alcohol or use drugs.  Allergies:  Allergies  Allergen Reactions  . Penicillins      (Not in a hospital admission)   Review of Systems  Constitutional: Negative.   HENT: Negative.   Eyes: Negative.   Respiratory: Negative.   Cardiovascular: Negative.   Gastrointestinal: Positive for abdominal pain, nausea and vomiting. Negative for blood in stool, constipation, diarrhea and heartburn.  Genitourinary: Negative.   Musculoskeletal: Negative.   Skin: Negative.   Neurological: Negative.   Endo/Heme/Allergies: Negative.   Psychiatric/Behavioral: Negative.      Physical Exam:  BP 107/67   Pulse 71   Temp 98 F (36.7 C) (Oral)   Resp 18   Ht _0  (1.6 m)   Wt 146 lb  (66.2 kg)   SpO2 98%   BMI 25.86 kg/m   Physical Exam  Constitutional: She is oriented to person, place, and time. She appears distressed.  Appears quite uncomfortable  HENT:  Head: Normocephalic and atraumatic.  Eyes: Pupils are equal, round, and reactive to light. Right eye exhibits no discharge. Left eye exhibits no discharge. No scleral icterus.  Neck: Normal range of motion.  Cardiovascular: Normal rate, regular rhythm and normal heart sounds.   Pulmonary/Chest: Effort normal and breath sounds normal. No respiratory distress. She has no wheezes. She has no rales.  Abdominal: Soft. She exhibits no distension. There is tenderness. There is guarding. There is no rebound.  Right upper quadrant tenderness with a positive Murphy sign  Musculoskeletal: Normal range of motion. She exhibits no edema or tenderness.  Lymphadenopathy:    She has no cervical adenopathy.  Neurological: She is alert and oriented to person, place, and time.  Skin: Skin is warm and dry. No rash noted. She is not diaphoretic. No erythema.  Multiple tattoos  Psychiatric: Mood and affect normal.  Vitals reviewed.       Results for orders placed or performed during the hospital encounter of 05/04/17 (from the past 48 hour(s))  CBC     Status: Abnormal   Collection Time: 05/04/17  3:23 AM  Result Value Ref Range   WBC 11.1 (H) 3.6 - 11.0 K/uL  RBC 3.54 (L) 3.80 - 5.20 MIL/uL   Hemoglobin 11.6 (L) 12.0 - 16.0 g/dL   HCT 33.9 (L) 35.0 - 47.0 %   MCV 95.9 80.0 - 100.0 fL   MCH 32.8 26.0 - 34.0 pg   MCHC 34.2 32.0 - 36.0 g/dL   RDW 14.6 (H) 11.5 - 14.5 %   Platelets 374 150 - 440 K/uL  Comprehensive metabolic panel     Status: Abnormal   Collection Time: 05/04/17  3:23 AM  Result Value Ref Range   Sodium 138 135 - 145 mmol/L   Potassium 3.9 3.5 - 5.1 mmol/L   Chloride 105 101 - 111 mmol/L   CO2 26 22 - 32 mmol/L   Glucose, Bld 104 (H) 65 - 99 mg/dL   BUN 13 6 - 20 mg/dL   Creatinine, Ser 0.71 0.44 -  1.00 mg/dL   Calcium 9.4 8.9 - 10.3 mg/dL   Total Protein 7.0 6.5 - 8.1 g/dL   Albumin 3.8 3.5 - 5.0 g/dL   AST 18 15 - 41 U/L   ALT 15 14 - 54 U/L   Alkaline Phosphatase 96 38 - 126 U/L   Total Bilirubin 0.5 0.3 - 1.2 mg/dL   GFR calc non Af Amer >60 >60 mL/min   GFR calc Af Amer >60 >60 mL/min    Comment: (NOTE) The eGFR has been calculated using the CKD EPI equation. This calculation has not been validated in all clinical situations. eGFR's persistently <60 mL/min signify possible Chronic Kidney Disease.    Anion gap 7 5 - 15  Lipase, blood     Status: None   Collection Time: 05/04/17  3:23 AM  Result Value Ref Range   Lipase 30 11 - 51 U/L   US Abdomen Limited Ruq  Result Date: 05/04/2017 CLINICAL DATA:  Right upper quadrant pain radiating to the back. Vomiting. EXAM: ULTRASOUND ABDOMEN LIMITED RIGHT UPPER QUADRANT COMPARISON:  None. FINDINGS: Gallbladder: Physiologically distended. Multiple mobile gallstones largest measuring 7 mm. Mild gallbladder wall thickening of 4 mm. No sonographic Murphy sign noted by sonographer. Common bile duct: Diameter: 4 mm, normal. Liver: No focal lesion identified. Within normal limits in parenchymal echogenicity. Normal directional flow in the main portal vein. IMPRESSION: 1. Cholelithiasis with mild gallbladder wall thickening. This may reflect acute cholecystitis in the appropriate clinical setting. Negative sonographic Murphy sign, however patient received morphine prior to the exam. 2. No biliary dilatation. Electronically Signed   By: Jeb Levering M.D.   On: 05/04/2017 04:51     Assessment/Plan  This patient with likely early acute cholecystitis her white blood cell count is slightly elevated and her ultrasound shows mild gallbladder wall thickening. Of more importance however is fact that she has unrelenting pain it has improved but has not gone away. Her abdomen is tender in the right upper quadrant with a positive Murphy sign. I'm  recommending admission to the hospital but I discussed with her the options of observation and being seen as an outpatient she wishes to come in the hospital and have surgery at this point the rationale for offering this was discussed the options of observation reviewed the risk of bleeding infection recurrence of symptoms failure to resolve all of her symptoms were all discussed with her we also discussed the risk of bleeding infection and bile duct damage or bowel injury necessitating conversion to an open procedure or conversion to an open procedure to alleviate ordered decrease that risk. All questions were answered for she and  her significant other they understood and agreed to proceed  Florene Glen, MD, FACS

## 2017-05-04 NOTE — ED Notes (Signed)
Reports called by nightshift, will wait 10 mins before taking pt up per floor.

## 2017-05-04 NOTE — Op Note (Signed)
SURGICAL OPERATIVE REPORT   DATE OF PROCEDURE: 05/04/2017  ATTENDING Surgeon(s): Ancil Linseyavis, Juquan Reznick Evan, MD  ANESTHESIA: GETA  PRE-OPERATIVE DIAGNOSIS: Acute Cholecystitis (K80.00)  POST-OPERATIVE DIAGNOSIS: Acute Cholecystitis (K80.00)  PROCEDURE(S): (cpt's: 47562) 1.) Laparoscopic Cholecystectomy  INTRAOPERATIVE FINDINGS: Gallbladder hydrops with mild-/moderate- peri-cholecystic fluid, particularly at the gallbladder infundibulum and cystic duct, significant edema between the gallbladder and gallbladder fossa of the liver  INTRAOPERATIVE FLUIDS: 500 mL crystalloid   ESTIMATED BLOOD LOSS: Minimal (<30 mL)   URINE OUTPUT: No foley  SPECIMENS: Gallbladder  IMPLANTS: None  DRAINS: None   COMPLICATIONS: None apparent   CONDITION AT COMPLETION: Hemodynamically stable and extubated  DISPOSITION: PACU   INDICATION(S) FOR PROCEDURE:  Patient is a 31 y.o. female who this admission presented with severe post-prandial RUQ > epigastric abdominal pain that did not improve. Patient reports multiple similar, but self-limited and far less post-prandial RUQ abdominal pain, some of which she attributed to pregnancy, having just recently delivered her first child 2 weeks ago. Ultrasound suggested acute cholecystitis. All risks, benefits, and alternatives to above elective procedures were discussed with the patient, who elected to proceed, and informed consent was accordingly obtained at that time.   DETAILS OF PROCEDURE:  Patient was brought to the operating suite and appropriately identified. General anesthesia was administered along with peri-operative prophylactic IV antibiotics, and endotracheal intubation was performed by anesthesiologist, along with NG/OG tube for gastric decompression. In supine position, operative site was prepped and draped in usual sterile fashion, and following a brief time out, initial 5 mm incision was made in a natural skin crease just above the umbilicus. Fascia was  then elevated, and a Verress needle was inserted and its proper position confirmed using aspiration and saline meniscus test.  Upon insufflation of the abdominal cavity with carbon dioxide to a well-tolerated pressure of 12-15 mmHg, 5 mm peri-umbilical port followed by laparoscope were inserted and used to inspect the abdominal cavity and its contents with no injuries from insertion of the first trochar noted. Three additional trocars were inserted, one at the epigastric position (10 mm) and two along the Right costal margin (5 mm). The table was then placed in reverse Trendelenburg position with the Right side up. Filmy adhesions between the gallbladder and omentum/duodenum/transverse colon were lysed using combined blunt and sharp dissection. The apex/dome of the gallbladder was grasped with an atraumatic grasper passed through the lateral port and retracted apically over the liver. The infundibulum was also grasped and retracted, exposing Calot's triangle. The peritoneum overlying the gallbladder infundibulum was incised and dissected free of surrounding peritoneal attachments, revealing the cystic duct and cystic artery, which were clipped twice on the patient side and once on the gallbladder specimen side close to the gallbladder. The gallbladder was then dissected from its peritoneal attachments to the liver using electrocautery, and the gallbladder was placed into a laparoscopic specimen bag and removed from the abdominal cavity via the epigastric port site. Hemostasis and secure placement of clips were confirmed, and intra-peritoneal cavity was inspected with no additional findings. PMI/Endoclose laparoscopic fascial closure device was then used to re-approximate fascia at the 10 mm epigastric port site.  All ports were then removed under direct visualization, and abdominal cavity was desuflated. All port sites were irrigated/cleaned,  additional local anesthetic was injected at each incision, 3-0 Vicryl  was used to re-approximate dermis at 10 mm port site(s), and subcuticular 4-0 Monocryl suture was used to re-approximate skin. Skin was then cleaned, dried, and sterile skin glue was applied. Patient  was then safely able to be awakened, extubated, and transferred to PACU for post-operative monitoring and care.   I was present for all aspects of procedure, and there were no intra-operative complications apparent.

## 2017-05-04 NOTE — Anesthesia Postprocedure Evaluation (Signed)
Anesthesia Post Note  Patient: Sandra LoganMaria Puerta  Procedure(s) Performed: Procedure(s) (LRB): LAPAROSCOPIC CHOLECYSTECTOMY (N/A)  Patient location during evaluation: PACU Anesthesia Type: General Level of consciousness: awake and alert and oriented Pain management: pain level controlled Vital Signs Assessment: post-procedure vital signs reviewed and stable Respiratory status: spontaneous breathing, nonlabored ventilation and respiratory function stable Cardiovascular status: blood pressure returned to baseline and stable Postop Assessment: no signs of nausea or vomiting Anesthetic complications: no     Last Vitals:  Vitals:   05/04/17 1203 05/04/17 1230  BP: 111/78 111/74  Pulse: 63 63  Resp: 13 19  Temp: 36.8 C 36.4 C    Last Pain:  Vitals:   05/04/17 1230  TempSrc: Oral  PainSc: 5                  Madisin Hasan

## 2017-05-04 NOTE — Anesthesia Procedure Notes (Signed)
Procedure Name: Intubation Date/Time: 05/04/2017 10:10 AM Performed by: Doreen Salvage Pre-anesthesia Checklist: Patient identified, Patient being monitored, Timeout performed, Emergency Drugs available and Suction available Patient Re-evaluated:Patient Re-evaluated prior to inductionOxygen Delivery Method: Circle System Utilized Preoxygenation: Pre-oxygenation with 100% oxygen Intubation Type: IV induction Ventilation: Mask ventilation without difficulty Laryngoscope Size: Mac and 3 Grade View: Grade I Tube type: Oral Tube size: 7.0 mm Number of attempts: 1 Airway Equipment and Method: Stylet Placement Confirmation: ETT inserted through vocal cords under direct vision,  positive ETCO2 and breath sounds checked- equal and bilateral Secured at: 21 cm Tube secured with: Tape Dental Injury: Teeth and Oropharynx as per pre-operative assessment

## 2017-05-04 NOTE — Transfer of Care (Signed)
Immediate Anesthesia Transfer of Care Note  Patient: Sandra LoganMaria Cardenas  Procedure(s) Performed: Procedure(s): LAPAROSCOPIC CHOLECYSTECTOMY (N/A)  Patient Location: PACU  Anesthesia Type:General  Level of Consciousness: sedated  Airway & Oxygen Therapy: Patient Spontanous Breathing and Patient connected to face mask oxygen  Post-op Assessment: Report given to RN and Post -op Vital signs reviewed and stable  Post vital signs: Reviewed and stable  Last Vitals:  Vitals:   05/04/17 0736 05/04/17 1118  BP: (!) 104/58 111/80  Pulse: 62 79  Resp:  (!) 21  Temp: 36.7 C 36.5 C    Complications: No apparent anesthesia complications

## 2017-05-05 ENCOUNTER — Encounter: Payer: Self-pay | Admitting: Surgery

## 2017-05-07 ENCOUNTER — Observation Stay: Payer: BC Managed Care – PPO

## 2017-05-07 ENCOUNTER — Observation Stay
Admission: EM | Admit: 2017-05-07 | Discharge: 2017-05-08 | Disposition: A | Discharge status: Home / Self Care | Payer: BC Managed Care – PPO | Attending: General Surgery | Admitting: General Surgery

## 2017-05-07 ENCOUNTER — Telehealth: Payer: Self-pay

## 2017-05-07 ENCOUNTER — Emergency Department: Payer: BC Managed Care – PPO

## 2017-05-07 DIAGNOSIS — R1011 Right upper quadrant pain: Secondary | ICD-10-CM | POA: Diagnosis present

## 2017-05-07 DIAGNOSIS — Z9049 Acquired absence of other specified parts of digestive tract: Secondary | ICD-10-CM | POA: Insufficient documentation

## 2017-05-07 DIAGNOSIS — Z880 Allergy status to penicillin: Secondary | ICD-10-CM | POA: Insufficient documentation

## 2017-05-07 DIAGNOSIS — Z800 Family history of malignant neoplasm of digestive organs: Secondary | ICD-10-CM | POA: Insufficient documentation

## 2017-05-07 DIAGNOSIS — G8918 Other acute postprocedural pain: Secondary | ICD-10-CM | POA: Insufficient documentation

## 2017-05-07 DIAGNOSIS — Z806 Family history of leukemia: Secondary | ICD-10-CM | POA: Insufficient documentation

## 2017-05-07 DIAGNOSIS — R1013 Epigastric pain: Secondary | ICD-10-CM | POA: Diagnosis not present

## 2017-05-07 DIAGNOSIS — R109 Unspecified abdominal pain: Secondary | ICD-10-CM | POA: Diagnosis present

## 2017-05-07 DIAGNOSIS — K5900 Constipation, unspecified: Secondary | ICD-10-CM | POA: Insufficient documentation

## 2017-05-07 DIAGNOSIS — Z833 Family history of diabetes mellitus: Secondary | ICD-10-CM | POA: Diagnosis not present

## 2017-05-07 LAB — COMPREHENSIVE METABOLIC PANEL
ALT: 25 U/L (ref 14–54)
ANION GAP: 6 (ref 5–15)
AST: 35 U/L (ref 15–41)
Albumin: 3.9 g/dL (ref 3.5–5.0)
Alkaline Phosphatase: 92 U/L (ref 38–126)
BUN: 9 mg/dL (ref 6–20)
CHLORIDE: 105 mmol/L (ref 101–111)
CO2: 27 mmol/L (ref 22–32)
Calcium: 9.3 mg/dL (ref 8.9–10.3)
Creatinine, Ser: 0.58 mg/dL (ref 0.44–1.00)
Glucose, Bld: 99 mg/dL (ref 65–99)
POTASSIUM: 3.9 mmol/L (ref 3.5–5.1)
Sodium: 138 mmol/L (ref 135–145)
Total Bilirubin: 0.5 mg/dL (ref 0.3–1.2)
Total Protein: 7.3 g/dL (ref 6.5–8.1)

## 2017-05-07 LAB — SURGICAL PATHOLOGY

## 2017-05-07 LAB — URINALYSIS, COMPLETE (UACMP) WITH MICROSCOPIC
BILIRUBIN URINE: NEGATIVE
GLUCOSE, UA: NEGATIVE mg/dL
KETONES UR: NEGATIVE mg/dL
Nitrite: NEGATIVE
PROTEIN: NEGATIVE mg/dL
Specific Gravity, Urine: 1.005 (ref 1.005–1.030)
pH: 6 (ref 5.0–8.0)

## 2017-05-07 LAB — CBC
HCT: 34.2 % — ABNORMAL LOW (ref 35.0–47.0)
Hemoglobin: 11.7 g/dL — ABNORMAL LOW (ref 12.0–16.0)
MCH: 32.5 pg (ref 26.0–34.0)
MCHC: 34.1 g/dL (ref 32.0–36.0)
MCV: 95.3 fL (ref 80.0–100.0)
Platelets: 344 10*3/uL (ref 150–440)
RBC: 3.58 MIL/uL — AB (ref 3.80–5.20)
RDW: 14.4 % (ref 11.5–14.5)
WBC: 8.5 10*3/uL (ref 3.6–11.0)

## 2017-05-07 LAB — LIPASE, BLOOD: LIPASE: 22 U/L (ref 11–51)

## 2017-05-07 MED ORDER — MORPHINE SULFATE (PF) 4 MG/ML IV SOLN
4.0000 mg | Freq: Once | INTRAVENOUS | Status: AC
  Administered 2017-05-07: 4 mg via INTRAVENOUS

## 2017-05-07 MED ORDER — MORPHINE SULFATE (PF) 4 MG/ML IV SOLN
INTRAVENOUS | Status: AC
  Filled 2017-05-07: qty 1

## 2017-05-07 MED ORDER — DIPHENHYDRAMINE HCL 25 MG PO CAPS: 25.0000 mg | ORAL_CAPSULE | Freq: Four times a day (QID) | ORAL | Status: DC | PRN

## 2017-05-07 MED ORDER — KCL IN DEXTROSE-NACL 20-5-0.45 MEQ/L-%-% IV SOLN
INTRAVENOUS | Status: DC
  Administered 2017-05-07 – 2017-05-08 (×2): via INTRAVENOUS
  Filled 2017-05-07 (×4): qty 1000

## 2017-05-07 MED ORDER — DIPHENHYDRAMINE HCL 50 MG/ML IJ SOLN: 25.0000 mg | Freq: Four times a day (QID) | INTRAMUSCULAR | Status: DC | PRN

## 2017-05-07 MED ORDER — ONDANSETRON HCL 4 MG/2ML IJ SOLN
4.0000 mg | INTRAMUSCULAR | Status: AC
  Administered 2017-05-07: 4 mg via INTRAVENOUS
  Filled 2017-05-07: qty 2

## 2017-05-07 MED ORDER — MORPHINE SULFATE (PF) 4 MG/ML IV SOLN: 4.0000 mg | INTRAVENOUS | Status: DC | PRN

## 2017-05-07 MED ORDER — IOPAMIDOL (ISOVUE-300) INJECTION 61%
15.0000 mL | INTRAVENOUS | Status: AC
  Administered 2017-05-07 (×2): 15 mL via ORAL

## 2017-05-07 MED ORDER — ONDANSETRON HCL 4 MG/2ML IJ SOLN: 4.0000 mg | Freq: Four times a day (QID) | INTRAMUSCULAR | Status: DC | PRN

## 2017-05-07 MED ORDER — MORPHINE SULFATE (PF) 4 MG/ML IV SOLN
4.0000 mg | Freq: Once | INTRAVENOUS | Status: AC
  Administered 2017-05-07: 4 mg via INTRAVENOUS
  Filled 2017-05-07: qty 1

## 2017-05-07 MED ORDER — IOPAMIDOL (ISOVUE-300) INJECTION 61%
100.0000 mL | Freq: Once | INTRAVENOUS | Status: AC | PRN
  Administered 2017-05-07: 100 mL via INTRAVENOUS

## 2017-05-07 MED ORDER — ONDANSETRON 4 MG PO TBDP: 4.0000 mg | ORAL_TABLET | Freq: Four times a day (QID) | ORAL | Status: DC | PRN

## 2017-05-07 MED ORDER — HYDRALAZINE HCL 20 MG/ML IJ SOLN: 10.0000 mg | INTRAMUSCULAR | Status: DC | PRN

## 2017-05-07 MED ORDER — MORPHINE SULFATE (PF) 4 MG/ML IV SOLN
INTRAVENOUS | Status: AC
  Administered 2017-05-07: 4 mg via INTRAVENOUS
  Filled 2017-05-07: qty 1

## 2017-05-07 MED ORDER — PANTOPRAZOLE SODIUM 40 MG IV SOLR
40.0000 mg | Freq: Every day | INTRAVENOUS | Status: DC
  Administered 2017-05-07: 40 mg via INTRAVENOUS
  Filled 2017-05-07: qty 40

## 2017-05-07 MED ORDER — KETOROLAC TROMETHAMINE 30 MG/ML IJ SOLN
30.0000 mg | Freq: Four times a day (QID) | INTRAMUSCULAR | Status: DC
  Administered 2017-05-07: 30 mg via INTRAVENOUS
  Filled 2017-05-07: qty 1

## 2017-05-07 MED ORDER — POLYETHYLENE GLYCOL 3350 17 G PO PACK
17.0000 g | PACK | Freq: Two times a day (BID) | ORAL | Status: DC
  Administered 2017-05-07 – 2017-05-08 (×2): 17 g via ORAL
  Filled 2017-05-07 (×2): qty 1

## 2017-05-07 NOTE — H&P (Signed)
Patient ID: Sandra Cardenas, female   DOB: 01/30/1986, 31 y.o.   MRN: [id number]  CC: Abdominal pain  HPI Sandra Cardenas is a 31 y.o. female returns to the ER today for acute onset of abdominal pain. Patient is 3 days status post laparoscopic cholecystectomy for acute cholecystitis. She reports that while eating earlier today she had acute onset of the identical pain she had before surgery. She had subjective nausea but no vomiting. It scared her and found her to come back to the emergency room. She's had a very significant past couple weeks with a delivery of a baby and then development of acute cholecystitis. She denies any fevers, chills, chest pain, shortness of breath, diarrhea, constipation. She is afraid of going home and that her pain might return.  HPI  Past Medical History:  Diagnosis Date  . Frequent headaches     Past Surgical History:  Procedure Laterality Date  . CHOLECYSTECTOMY N/A 05/04/2017   Procedure: LAPAROSCOPIC CHOLECYSTECTOMY;  Surgeon: Ancil Linseyavis, Jason Evan, MD;  Location: [facility] ORS;  Service: General;  Laterality: N/A;    Family History  Problem Relation Age of Onset  . Diabetes Paternal Grandmother   . Pancreatic cancer Paternal Grandmother   . Diabetes Paternal Grandfather   . Leukemia Maternal Grandmother   . Breast cancer Neg Hx   . Ovarian cancer Neg Hx   . Colon cancer Neg Hx   . Heart disease Neg Hx     Social History Social History  Substance Use Topics  . Smoking status: Never Smoker  . Smokeless tobacco: Never Used  . Alcohol use No    Allergies  Allergen Reactions  . Penicillins Rash    Has patient had a PCN reaction causing immediate rash, facial/tongue/throat swelling, SOB or lightheadedness with hypotension: Yes Has patient had a PCN reaction causing severe rash involving mucus membranes or skin necrosis: No Has patient had a PCN reaction that required hospitalization: No Has patient had a PCN reaction occurring within the last 10 years: No If  all of the above answers are "NO", then may proceed with Cephalosporin use.     No current facility-administered medications for this encounter.    Current Outpatient Prescriptions  Medication Sig Dispense Refill  . acetaminophen (TYLENOL) 500 MG tablet Take 1,000 mg by mouth every 6 (six) hours as needed.    . Iron-FA-B Cmp-C-Biot-Probiotic (FUSION PLUS) CAPS Take 1 capsule by mouth daily. 60 capsule 1  . Prenatal Vit-Fe Fumarate-FA (PRENATAL MULTIVITAMIN) TABS tablet Take 1 tablet by mouth daily at 12 noon.    . vitamin C (ASCORBIC ACID) 500 MG tablet Take 500 mg by mouth daily.    Marland Kitchen. oxyCODONE (OXY IR/ROXICODONE) 5 MG immediate release tablet Take 1 tablet (5 mg total) by mouth every 6 (six) hours as needed for severe pain. (Patient not taking: Reported on 05/07/2017) 30 tablet 0     Review of Systems A Multi-point review of systems was asked and was negative except for the findings documented in the history of present illness  Physical Exam Blood pressure 112/74, pulse 63, temperature 97.7 F (36.5 C), temperature source Oral, resp. rate 16, height 5\' 4"  (1.626 m), weight 66.2 kg (146 lb), SpO2 100 %, currently breastfeeding. CONSTITUTIONAL: No acute distress. EYES: Pupils are equal, round, and reactive to light, Sclera are non-icteric. EARS, NOSE, MOUTH AND THROAT: The oropharynx is clear. The oral mucosa is pink and moist. Hearing is intact to voice. LYMPH NODES:  Lymph nodes in the neck are normal.  RESPIRATORY:  Lungs are clear. There is normal respiratory effort, with equal breath sounds bilaterally, and without pathologic use of accessory muscles. CARDIOVASCULAR: Heart is regular without murmurs, gallops, or rubs. GI: The abdomen is soft, tender to palpation in the right upper quadrant, and nondistended. There are no palpable masses. There is no hepatosplenomegaly. There are normal bowel sounds in all quadrants. GU: Rectal deferred.   MUSCULOSKELETAL: Normal muscle strength and  tone. No cyanosis or edema.   SKIN: Turgor is good and there are no pathologic skin lesions or ulcers. NEUROLOGIC: Motor and sensation is grossly normal. Cranial nerves are grossly intact. PSYCH:  Oriented to person, place and time. Affect is normal.  Data Reviewed Images and labs reviewed. Labs are all within normal limits with a normal white blood cell count of 8.5, normal LFTs and normal electrolytes. Ultrasound the right upper quadrant failed to show any obvious abnormalities. I have personally reviewed the patient's imaging, laboratory findings and medical records.    Assessment    Abdominal pain    Plan    31 year old female with acute abdominal pain 3 days status post laparoscopic cholecystectomy. Given the patient's symptoms, timing, normal findings right her the option of coming in for observation and a CT scan versus going home with close follow-up in clinic. Due to the patient's fear of returning pain at home she desires to coming to the hospital for observation. We will order a CT scan and keep her nothing by mouth until after those results are returned. If no other pathology is identified within restart diet and oral pain medications.     Time spent with the patient was 45 minutes, with more than 50% of the time spent in face-to-face education, counseling and care coordination.     Ricarda Frame, MD FACS General Surgeon 05/07/2017, 4:59 PM

## 2017-05-07 NOTE — ED Triage Notes (Signed)
Pt c/o right upper quad pain - she had gallbladder removed Saturday and today started with severe right upper quad pain - pt also had a baby 3 weeks ago - she reports nausea but denies vomiting - also c/o constipation

## 2017-05-07 NOTE — Telephone Encounter (Signed)
Patient had Cholecystectomy 05/04/17.  She states she is having increase in pain and it is causing shortness of breath.  She denies fever or chills, nausea or vomiting. She states she feels dizzy. She denies having bowel movement since before surgery. We discussed possibly constipated but due to short of breath and patient feeling ill she was instructed to go to emergency room as we do not have a doctor in the office today.

## 2017-05-07 NOTE — ED Notes (Signed)
Patient transported to Ultrasound 

## 2017-05-07 NOTE — ED Provider Notes (Signed)
[facility] Emergency Department Provider Note  ____________________________________________  First Provider Contact:  14:55   (approximate)  I have reviewed the triage vital signs and the nursing notes.   HISTORY  Chief Complaint Abdominal Pain and Post-op Problem    HPI Sandra Cardenas is a 31 y.o. female who presents for evaluation of acute onset severe epigastric and right upper quadrant pain.  She had a baby about 3 weeks ago and then last week came into the emergency department and was diagnosed with acute cholecystitis.  She had a laparoscopic cholecystectomy 3 days ago that appears to have an uncomplicated and she was discharged with a prescription for oxycodone.  She states that she is breast-feeding so she did not want to continue using the narcotics pain medicine but her pain was controlled with Tylenol until today.  At approximately 11:00 AM she was eating some fruit and had cute onset of severe pain in her epigastrium and radiating to her right upper quadrant and her back, which feels exactly like the pain she had prior to her surgery.  She has had some nausea but no vomiting.  Nothing in particular makes the patient's symptoms better nor worse.  The pain continues to be severe.  She denies fever/chills, chest pain, shortness of breath, dysuria.  Past Medical History:  Diagnosis Date  . Frequent headaches     Patient Active Problem List   Diagnosis Date Noted  . Abdominal pain 05/07/2017  . Acute cholecystitis 05/04/2017  . Encounter for elective induction of labor 04/15/2017  . Left hip pain 10/01/2014    Past Surgical History:  Procedure Laterality Date  . CHOLECYSTECTOMY N/A 05/04/2017   Procedure: LAPAROSCOPIC CHOLECYSTECTOMY;  Surgeon: Ancil Linsey, MD;  Location: [facility] ORS;  Service: General;  Laterality: N/A;    Prior to Admission medications   Medication Sig Start Date End Date Taking? Authorizing Provider  acetaminophen (TYLENOL)  500 MG tablet Take 1,000 mg by mouth every 6 (six) hours as needed.   Yes [provider]  Iron-FA-B Cmp-C-Biot-Probiotic (FUSION PLUS) CAPS Take 1 capsule by mouth daily. 04/18/17  Yes Shambley, Melody N, CNM  Prenatal Vit-Fe Fumarate-FA (PRENATAL MULTIVITAMIN) TABS tablet Take 1 tablet by mouth daily at 12 noon.   Yes [provider]  vitamin C (ASCORBIC ACID) 500 MG tablet Take 500 mg by mouth daily.   Yes [provider]  oxyCODONE (OXY IR/ROXICODONE) 5 MG immediate release tablet Take 1 tablet (5 mg total) by mouth every 6 (six) hours as needed for severe pain. Patient not taking: Reported on 05/07/2017 05/04/17   Ancil Linsey, MD    Allergies Penicillins  Family History  Problem Relation Age of Onset  . Diabetes Paternal Grandmother   . Pancreatic cancer Paternal Grandmother   . Diabetes Paternal Grandfather   . Leukemia Maternal Grandmother   . Breast cancer Neg Hx   . Ovarian cancer Neg Hx   . Colon cancer Neg Hx   . Heart disease Neg Hx     Social History Social History  Substance Use Topics  . Smoking status: Never Smoker  . Smokeless tobacco: Never Used  . Alcohol use No    Review of Systems Constitutional: No fever/chills Eyes: No visual changes. ENT: No sore throat. Cardiovascular: Denies chest pain. Respiratory: Denies shortness of breath. Gastrointestinal: Acute onset epigastric and right upper quadrant pain with nausea, about 3 days status post cholecystectomy Genitourinary: Negative for dysuria. Musculoskeletal: Negative for neck pain.  Negative  for back pain. Integumentary: Negative for rash. Neurological: Negative for headaches, focal weakness or numbness.   ____________________________________________   PHYSICAL EXAM:  VITAL SIGNS: ED Triage Vitals  Enc Vitals Group     BP 05/07/17 1320 127/84     Pulse Rate 05/07/17 1320 70     Resp 05/07/17 1320 16     Temp 05/07/17 1320 97.7 F (36.5 C)     Temp Source  05/07/17 1320 Oral     SpO2 05/07/17 1320 100 %     Weight 05/07/17 1320 66.2 kg (146 lb)     Height 05/07/17 1320 1.6 m (5\' 3" )     Head Circumference --      Peak Flow --      Pain Score 05/07/17 1324 9     Pain Loc --      Pain Edu? --      Excl. in GC? --     Constitutional: Alert and oriented. Generally well-appearing but is in moderate distress due to pain and obviously uncomfortable Eyes: Conjunctivae are normal.  Head: Atraumatic. Nose: No congestion/rhinnorhea. Mouth/Throat: Mucous membranes are moist. Neck: No stridor.  No meningeal signs.   Cardiovascular: Normal rate, regular rhythm. Good peripheral circulation. Grossly normal heart sounds. Respiratory: Normal respiratory effort.  No retractions. Lungs CTAB. Gastrointestinal: Soft with guarding and severe tenderness to palpation of her epigastrium and right upper quadrant with positive Murphy sign.  The surgical wounds from the laparoscopy are well-appearing.  Her lower abdomen is diffusely tender to palpation without any focal tenderness and no guarding. Musculoskeletal: No lower extremity tenderness nor edema. No gross deformities of extremities. Neurologic:  Normal speech and language. No gross focal neurologic deficits are appreciated.  Skin:  Skin is warm, dry and intact. No rash noted. Psychiatric: Mood and affect are normal. Speech and behavior are normal.  ____________________________________________   LABS (all labs ordered are listed, but only abnormal results are displayed)  Labs Reviewed  CBC - Abnormal; Notable for the following:       Result Value   RBC 3.58 (*)    Hemoglobin 11.7 (*)    HCT 34.2 (*)    All other components within normal limits  URINALYSIS, COMPLETE (UACMP) WITH MICROSCOPIC - Abnormal; Notable for the following:    Color, Urine STRAW (*)    APPearance CLEAR (*)    Hgb urine dipstick SMALL (*)    Leukocytes, UA MODERATE (*)    Bacteria, UA RARE (*)    Squamous Epithelial / LPF 0-5  (*)    All other components within normal limits  LIPASE, BLOOD  COMPREHENSIVE METABOLIC PANEL   ____________________________________________  EKG  None - EKG not ordered by ED physician ____________________________________________  RADIOLOGY   [place] Abdomen Limited Ruq  Result Date: 05/07/2017 CLINICAL DATA:  Severe epigastric and right upper quadrant pain for 3 days after cholecystectomy. Concern for retained stone. EXAM: ULTRASOUND ABDOMEN LIMITED RIGHT UPPER QUADRANT COMPARISON:  05/04/2017 FINDINGS: Gallbladder: Surgically absent. No evidence of fluid collection in the gallbladder fossa. Common bile duct: Diameter: 4 mm.  Where visualized, no filling defect. Liver: No focal lesion identified. Within normal limits in parenchymal echogenicity. Antegrade flow in the imaged hepatic and portal venous system. IMPRESSION: No unexpected finding after cholecystectomy. No common bile duct dilatation or visualized perihepatic fluid collection. Electronically Signed   By: Marnee SpringJonathon  Watts M.D.   On: 05/07/2017 16:04    ____________________________________________   PROCEDURES  Critical Care performed: No   Procedure(s) performed:  Procedures   ____________________________________________   INITIAL IMPRESSION / ASSESSMENT AND PLAN / ED COURSE  Pertinent labs & imaging results that were available during my care of the patient were reviewed by me and considered in my medical decision making (see chart for details).   Clinical Course as of May 07 1813  Tue May 07, 2017  1503 Concern for retained stone.  IV analgesia and antiemetics.  Discussed by phone with Dr. Tonita Cong who will come see the patient and also requested a right upper quadrant ultrasound to evaluate for any evidence of retained stone.  I will update the family.  Labs reassuring at this time and patient is hemodynamically stable.  [CF]  1544 The patient knows how to "pump and dump" her breast milk after getting narcotics  and plans to do so.  [CF]  1614 Normal post-chole U/S.  Spoke with Dr. Tonita Cong in person and he is currently evaluating the patient in person. US Abdomen Limited RUQ [CF]  1620 Dr. Tonita Cong is the case with me in person and will bring the patient into the hospital for observation and further evaluation either with CT scan or MRCP  [CF]    Clinical Course User Index [CF] Loleta Rose, MD    ____________________________________________  FINAL CLINICAL IMPRESSION(S) / ED DIAGNOSES  Final diagnoses:  Post-op pain  RUQ abdominal pain     MEDICATIONS GIVEN DURING THIS VISIT:  Medications  morphine 4 MG/ML injection 4 mg (4 mg Intravenous Given 05/07/17 1513)  ondansetron (ZOFRAN) injection 4 mg (4 mg Intravenous Given 05/07/17 1513)  morphine 4 MG/ML injection 4 mg (4 mg Intravenous Given 05/07/17 1628)     NEW OUTPATIENT MEDICATIONS STARTED DURING THIS VISIT:  New Prescriptions   No medications on file    Modified Medications   No medications on file    Discontinued Medications   No medications on file     Note:  This document was prepared using Dragon voice recognition software and may include unintentional dictation errors.    Loleta Rose, MD 05/07/17 [phone]

## 2017-05-08 DIAGNOSIS — R1011 Right upper quadrant pain: Secondary | ICD-10-CM | POA: Diagnosis not present

## 2017-05-08 MED ORDER — OXYCODONE HCL 5 MG PO TABS: 5.0000 mg | ORAL_TABLET | Freq: Four times a day (QID) | ORAL | Status: DC | PRN

## 2017-05-08 MED ORDER — POLYETHYLENE GLYCOL 3350 17 G PO PACK: 17.0000 g | PACK | Freq: Two times a day (BID) | ORAL | 0 refills | Status: DC

## 2017-05-08 NOTE — Progress Notes (Signed)
CC: Abdominal pain Subjective: Patient reports that her abdominal pain is much improved. She has been tolerating a diet this morning but is yet to have a bowel movement.  Objective: Vital signs in last 24 hours: Temp:  [97.6 F (36.4 C)-98.1 F (36.7 C)] 97.6 F (36.4 C) (06/13 0806) Pulse Rate:  [54-74] 59 (06/13 0806) Resp:  [12-18] 12 (06/13 0806) BP: (106-127)/(70-84) 112/75 (06/13 0806) SpO2:  [100 %] 100 % (06/13 0806) Weight:  [66.2 kg (146 lb)] 66.2 kg (146 lb) (06/12 1325)    Intake/Output from previous day: 06/12 0701 - 06/13 0700 In: 506.3 [I.V.:506.3] Out: 1400 [Urine:1400] Intake/Output this shift: Total I/O In: 1683 [P.O.:760; I.V.:923] Out: 600 [Urine:600]  Physical exam:  Gen.: No acute distress Chest: Clear to auscultation Heart: Regular rate and rhythm Abdomen: Soft, nontender, nondistended. Well approximately lap scopic incision sites evidence of erythema or drainage.  Lab Results: CBC   Recent Labs  05/07/17 1328  WBC 8.5  HGB 11.7*  HCT 34.2*  PLT 344   BMET  Recent Labs  05/07/17 1328  NA 138  K 3.9  CL 105  CO2 27  GLUCOSE 99  BUN 9  CREATININE 0.58  CALCIUM 9.3   PT/INR No results for input(s): LABPROT, INR in the last 72 hours. ABG No results for input(s): PHART, HCO3 in the last 72 hours.  Invalid input(s): PCO2, PO2  Studies/Results: Ct Abdomen Pelvis W Contrast  Result Date: 05/08/2017 CLINICAL DATA:  Acute onset of right upper quadrant abdominal pain and nausea. Constipation. Initial encounter. EXAM: CT ABDOMEN AND PELVIS WITH CONTRAST TECHNIQUE: Multidetector CT imaging of the abdomen and pelvis was performed using the standard protocol following bolus administration of intravenous contrast. CONTRAST:  100mL ISOVUE-300 IOPAMIDOL (ISOVUE-300) INJECTION 61% COMPARISON:  Right upper quadrant ultrasound performed earlier today at 3:31 p.m. FINDINGS: Lower chest: Minimal bibasilar atelectasis is noted. The visualized  portions of the mediastinum are unremarkable. Hepatobiliary: The liver is unremarkable in appearance. The patient is status post cholecystectomy, with clips noted at the gallbladder fossa. Minimal postoperative soft tissue inflammation is noted about the right upper quadrant, and along the anterior abdominal wall. The common bile duct remains normal in caliber. Pancreas: The pancreas is within normal limits. Spleen: The spleen is unremarkable in appearance. Adrenals/Urinary Tract: The adrenal glands are unremarkable in appearance. The kidneys are within normal limits. There is no evidence of hydronephrosis. No renal or ureteral stones are identified. No perinephric stranding is seen. Stomach/Bowel: The stomach is unremarkable in appearance. The small bowel is within normal limits. The appendix is not visualized; there is no evidence for appendicitis. The colon is unremarkable in appearance. Vascular/Lymphatic: The abdominal aorta is unremarkable in appearance. The inferior vena cava is grossly unremarkable. No retroperitoneal lymphadenopathy is seen. No pelvic sidewall lymphadenopathy is identified. Reproductive: The bladder is moderately distended and grossly remarkable. The uterus is grossly unremarkable in appearance. The ovaries are relatively symmetric. No suspicious adnexal masses are seen. A clip is noted at the right adnexa. Other: No additional soft tissue abnormalities are seen. Musculoskeletal: No acute osseous abnormalities are identified. The visualized musculature is unremarkable in appearance. IMPRESSION: 1. No acute abnormality seen within the abdomen or pelvis. 2. Status post cholecystectomy, with minimal postoperative soft tissue inflammation about the right upper quadrant and along the anterior abdominal wall. Electronically Signed   By: Roanna RaiderJeffery  Chang M.D.   On: 05/08/2017 00:09   [place] Abdomen Limited Ruq  Result Date: 05/07/2017 CLINICAL DATA:  Severe epigastric and  right upper quadrant pain  for 3 days after cholecystectomy. Concern for retained stone. EXAM: ULTRASOUND ABDOMEN LIMITED RIGHT UPPER QUADRANT COMPARISON:  05/04/2017 FINDINGS: Gallbladder: Surgically absent. No evidence of fluid collection in the gallbladder fossa. Common bile duct: Diameter: 4 mm.  Where visualized, no filling defect. Liver: No focal lesion identified. Within normal limits in parenchymal echogenicity. Antegrade flow in the imaged hepatic and portal venous system. IMPRESSION: No unexpected finding after cholecystectomy. No common bile duct dilatation or visualized perihepatic fluid collection. Electronically Signed   By: Marnee Spring M.D.   On: 05/07/2017 16:04    Anti-infectives: Anti-infectives    None      Assessment/Plan:  31 year old female with abdominal pain after laparoscopic cholecystectomy. No additional pathology identified on CT scan. Discussed that the pain is likely a combination of musculoskeletal pain as well as constipation. Plan to restart oral pain medication and a bowel regimen. Encourage ambulation, incentive spirometer usage. Likely discharge home later today.  Deontray Hunnicutt T. Tonita Cong, MD, [facility] General Surgeon [facility]  Day [facility] [phone] Night [facility] [phone] 05/08/2017

## 2017-05-08 NOTE — Discharge Instructions (Signed)

## 2017-05-08 NOTE — Progress Notes (Signed)
05/08/2017  4:30 PM  Rozelle LoganMaria Puerta to be D/C'd Home per MD order.  Discussed prescriptions and follow up appointments with the patient. Prescriptions given to patient, medication list explained in detail. Pt verbalized understanding.  Allergies as of 05/08/2017      Reactions   Penicillins Rash   Has patient had a PCN reaction causing immediate rash, facial/tongue/throat swelling, SOB or lightheadedness with hypotension: Yes Has patient had a PCN reaction causing severe rash involving mucus membranes or skin necrosis: No Has patient had a PCN reaction that required hospitalization: No Has patient had a PCN reaction occurring within the last 10 years: No If all of the above answers are "NO", then may proceed with Cephalosporin use.      Medication List    TAKE these medications   acetaminophen 500 MG tablet Commonly known as:  TYLENOL Take 1,000 mg by mouth every 6 (six) hours as needed.   FUSION PLUS Caps Take 1 capsule by mouth daily.   oxyCODONE 5 MG immediate release tablet Commonly known as:  Oxy IR/ROXICODONE Take 1 tablet (5 mg total) by mouth every 6 (six) hours as needed for severe pain.   polyethylene glycol packet Commonly known as:  MIRALAX / GLYCOLAX Take 17 g by mouth 2 (two) times daily.   prenatal multivitamin Tabs tablet Take 1 tablet by mouth daily at 12 noon.   vitamin C 500 MG tablet Commonly known as:  ASCORBIC ACID Take 500 mg by mouth daily.       Vitals:   05/08/17 0806 05/08/17 1353  BP: 112/75 108/74  Pulse: (!) 59 88  Resp: 12   Temp: 97.6 F (36.4 C) 98.1 F (36.7 C)    Skin clean, dry and intact without evidence of skin break down, no evidence of skin tears noted. IV catheter discontinued intact. Site without signs and symptoms of complications. Dressing and pressure applied. Pt denies pain at this time. No complaints noted.  An After Visit Summary was printed and given to the patient. Patient escorted via WC, and D/C home via private  auto.  Bradly Chrisougherty, Anguel Delapena E

## 2017-05-08 NOTE — Discharge Summary (Signed)
Patient ID: Sandra LoganMaria Cardenas MRN: [id number] DOB/AGE: 32/03/1986 32 y.o.  Admit date: 05/07/2017 Discharge date: 05/08/2017  Discharge Diagnoses:  Abdominal pain  Procedures Performed: None  Discharged Condition: good  Hospital Course: Patient brought in for abdominal pain. Pain resolved and all workup negative except for large stool burden. Doing well.  Discharge Orders: Discharge Instructions    Call MD for:  difficulty breathing, headache or visual disturbances    Complete by:  As directed    Call MD for:  hives    Complete by:  As directed    Call MD for:  persistant nausea and vomiting    Complete by:  As directed    Call MD for:  redness, tenderness, or signs of infection (pain, swelling, redness, odor or green/yellow discharge around incision site)    Complete by:  As directed    Call MD for:  severe uncontrolled pain    Complete by:  As directed    Call MD for:  temperature >100.4    Complete by:  As directed    Diet - low sodium heart healthy    Complete by:  As directed    Increase activity slowly    Complete by:  As directed       Disposition: 01-Home or Self Care  Discharge Medications: Allergies as of 05/08/2017      Reactions   Penicillins Rash   Has patient had a PCN reaction causing immediate rash, facial/tongue/throat swelling, SOB or lightheadedness with hypotension: Yes Has patient had a PCN reaction causing severe rash involving mucus membranes or skin necrosis: No Has patient had a PCN reaction that required hospitalization: No Has patient had a PCN reaction occurring within the last 10 years: No If all of the above answers are "NO", then may proceed with Cephalosporin use.      Medication List    TAKE these medications   acetaminophen 500 MG tablet Commonly known as:  TYLENOL Take 1,000 mg by mouth every 6 (six) hours as needed.   FUSION PLUS Caps Take 1 capsule by mouth daily.   oxyCODONE 5 MG immediate release tablet Commonly known as:   Oxy IR/ROXICODONE Take 1 tablet (5 mg total) by mouth every 6 (six) hours as needed for severe pain.   polyethylene glycol packet Commonly known as:  MIRALAX / GLYCOLAX Take 17 g by mouth 2 (two) times daily.   prenatal multivitamin Tabs tablet Take 1 tablet by mouth daily at 12 noon.   vitamin C 500 MG tablet Commonly known as:  ASCORBIC ACID Take 500 mg by mouth daily.        Follwup: Follow-up Information    Ancil Linseyavis, Jason Evan, MD. Go in 2 week(s).   Specialty:  General Surgery Why:  keep already schedule appointment Contact information: [address] [place] [phone]           Signed: Ricarda FrameCharles Gary Bultman 05/08/2017, 11:40 AM

## 2017-05-09 ENCOUNTER — Telehealth: Payer: Self-pay

## 2017-05-09 NOTE — Telephone Encounter (Signed)
Post-op call made to patient at this time. Spoke with Sandra LoganMaria Cardenas. Post-op interview questions below.  1. How are you feeling? Feeling better  2. Is your pain controlled? Yes  3. What are you doing for the pain? Taking tylenol when having pain from the sutures  4. Are you having any Nausea or Vomiting? No  5. Are you having any Fever or Chills? No  6. Are you having any Constipation or Diarrhea? Had some constipation  7. Is there any Swelling or Bruising you are concerned about? no  8. Do you have any questions or concerns at this time? None   Discussion: Went back ED on Tuesday from having stomach pain and was admitted. Stayed until Wednesday. While at the hospital was give morphine and had a CT scan and US. Found that she was constipated from having her surgery and was prescribed Miralax which has been working well for her. She has only had to take the oxycodone once. She no longer is taking it because she is breastfeeding at this time. She only takes tylenol when need. She did mention that she has a small bruise by her bellybutton. I informed her that as long as it does not get fire engine red, swell, have yellow-greenish/creamy white drainage, or warm to the touch that the bruise should be normal and will go away on its own with time. I also reminded patient of her post-op appointment with Dr. Earlene Plateravis on 05/20/2017 and to give our office a call if she had any questions/concerns prior to that appointment. Patient verbalized understanding.

## 2017-05-20 ENCOUNTER — Ambulatory Visit (INDEPENDENT_AMBULATORY_CARE_PROVIDER_SITE_OTHER): Payer: BC Managed Care – PPO | Admitting: Surgery

## 2017-05-20 ENCOUNTER — Encounter: Payer: Self-pay | Admitting: Surgery

## 2017-05-20 VITALS — BP 113/78 | HR 101 | Temp 97.8°F | Ht 64.0 in | Wt 134.6 lb

## 2017-05-20 DIAGNOSIS — K8000 Calculus of gallbladder with acute cholecystitis without obstruction: Secondary | ICD-10-CM

## 2017-05-20 NOTE — Progress Notes (Signed)
Surgical Clinic Progress/Follow-up Note   HPI:  31 y.o. Female presents to clinic for post-op follow-up evaluation s/p laparoscopic cholecystectomy for acute cholecystitis. Patient reports complete resolution of pre-operative post-prandial RUQ abdominal pain and has been tolerating regular diet with +flatus and normal BM's, denies N/V, fever/chills, CP, or SOB.  Review of Systems:  Constitutional: denies any other weight loss, fever, chills, or sweats  Eyes: denies any other vision changes, history of eye injury  ENT: denies sore throat, hearing problems  Respiratory: denies shortness of breath, wheezing  Cardiovascular: denies chest pain, palpitations  Gastrointestinal: abdominal pain, N/V, and bowel function as per HPI Musculoskeletal: denies any other joint pains or cramps  Skin: Denies any other rashes or skin discolorations  Neurological: denies any other headache, dizziness, weakness  Psychiatric: denies any other depression, anxiety  All other review of systems: otherwise negative   Vital Signs:  BP 113/78   Pulse (!) 101   Temp 97.8 F (36.6 C) (Oral)   Ht 5\' 4"  (1.626 m)   Wt 134 lb 9.6 oz (61.1 kg)   BMI 23.10 kg/m    Physical Exam:  Constitutional:  -- Normal body habitus  -- Awake, alert, and oriented x3  Eyes:  -- Pupils equally round and reactive to light  -- No scleral icterus  Ear, nose, throat:  -- No jugular venous distension  -- No nasal drainage, bleeding Pulmonary:  -- No crackles -- Equal breath sounds bilaterally -- Breathing non-labored at rest Cardiovascular:  -- S1, S2 present  -- No pericardial rubs  Gastrointestinal:  -- Soft, minimal epigastric peri-incisional tenderness to palpation, nondistended, no guarding/rebound -- Incisions well-approximated without surrounding erythema or drainage -- No abdominal masses appreciated, pulsatile or otherwise  Musculoskeletal / Integumentary:  -- Wounds or skin discoloration: None appreciated  except post-surgical wounds as described above (GI) -- Extremities: B/L UE and LE FROM, hands and feet warm, no edema  Neurologic:  -- Motor function: intact and symmetric  -- Sensation: intact and symmetric   Assessment:  31 y.o. yo Female with a problem list including...  Patient Active Problem List   Diagnosis Date Noted  . RUQ abdominal pain   . Abdominal pain 05/07/2017  . Calculus of gallbladder with acute cholecystitis without obstruction 05/04/2017  . Encounter for elective induction of labor 04/15/2017  . Left hip pain 10/01/2014    presents to clinic for post-op follow-up evaluation, doing well s/p laparoscopic cholecystectomy for acute cholecystitis.  Plan:              - advance diet as tolerated              - okay to submerge incisions under water (baths, swimming) prn             - gradually resume all activities without restrictions over next 2 weeks             - apply sunblock particularly to incisions with sun exposure to reduce pigmentation of scars             - return to clinic as needed, instructed to call office if any questions or concerns   All of the above recommendations were discussed with the patient and patient's hsuband, and all of patient's and her husband's questions were answered to their expressed satisfaction.  -- Scherrie GerlachJason E. Earlene Plateravis, MD, RPVI [place]: [facility] - Partnering for exceptional care. Office: [phone]

## 2017-05-20 NOTE — Patient Instructions (Signed)
Please call our office with any questions or concerns.  Please do not submerge in a tub, hot tub, or pool until incisions are completely sealed.  Use sun block to incision area over the next year if this area will be exposed to sun. This helps decrease scarring.  You may resume your normal activities on 06/15/17. At that time- Listen to your body when lifting, if you have pain when lifting, stop and then try again in a few days. Pain after doing exercises or activities of daily living is normal as you get back in to your normal routine.  If you develop redness, drainage, or pain at incision sites- call our office immediately and speak with a nurse.

## 2017-05-21 ENCOUNTER — Encounter: Payer: Self-pay | Admitting: Obstetrics and Gynecology

## 2017-05-21 ENCOUNTER — Ambulatory Visit (INDEPENDENT_AMBULATORY_CARE_PROVIDER_SITE_OTHER): Payer: BC Managed Care – PPO | Admitting: Obstetrics and Gynecology

## 2017-05-21 NOTE — Patient Instructions (Signed)
  Place postpartum visit patient instructions here.  

## 2017-05-21 NOTE — Progress Notes (Signed)
  Subjective:     Sandra LoganMaria Cardenas is a 31 y.o. female who presents for a postpartum visit. She is 6 weeks postpartum following a spontaneous vaginal delivery. I have fully reviewed the prenatal and intrapartum course. The delivery was at 41 gestational weeks. Outcome: spontaneous vaginal delivery. Anesthesia: none. Postpartum course has been complicated by gallstones and surgery for removal. Baby's course has been uncomplicated. Baby is feeding by breast. Bleeding thin lochia. Bowel function is somewhat constipated. Bladder function is normal. Patient is not sexually active. Contraception method is abstinence. Postpartum depression screening: negative.  The following portions of the patient's history were reviewed and updated as appropriate: allergies, current medications, past family history, past medical history, past social history, past surgical history and problem list.  Review of Systems A comprehensive review of systems was negative.   Objective:    BP 107/72   Pulse 70   Wt 134 lb (60.8 kg)   BMI 23.00 kg/m   General:  alert, cooperative and appears stated age   Breasts:  inspection negative, no nipple discharge or bleeding, no masses or nodularity palpable  Lungs: clear to auscultation bilaterally  Heart:  regular rate and rhythm, S1, S2 normal, no murmur, click, rub or gallop  Abdomen: soft, non-tender; bowel sounds normal; no masses,  no organomegaly   Vulva:  normal  Vagina: normal vagina, no discharge, exudate, lesion, or erythema  Cervix:  multiparous appearance  Corpus: normal size, contour, position, consistency, mobility, non-tender  Adnexa:  normal adnexa and no mass, fullness, tenderness  Rectal Exam: Not performed.        Assessment:     6 weeks postpartum exam. Pap smear not done at today's visit.   Plan:    1. Contraception: condoms 2. S/p cholecystectomy 3. Follow up in: 3 months or as needed.

## 2017-05-22 LAB — CBC
HEMATOCRIT: 35.2 % (ref 34.0–46.6)
Hemoglobin: 12 g/dL (ref 11.1–15.9)
MCH: 31.1 pg (ref 26.6–33.0)
MCHC: 34.1 g/dL (ref 31.5–35.7)
MCV: 91 fL (ref 79–97)
PLATELETS: 383 10*3/uL — AB (ref 150–379)
RBC: 3.86 x10E6/uL (ref 3.77–5.28)
RDW: 14.3 % (ref 12.3–15.4)
WBC: 8.3 10*3/uL (ref 3.4–10.8)

## 2017-05-22 LAB — FERRITIN: Ferritin: 60 ng/mL (ref 15–150)

## 2017-05-22 LAB — VITAMIN D 25 HYDROXY (VIT D DEFICIENCY, FRACTURES): Vit D, 25-Hydroxy: 42.6 ng/mL (ref 30.0–100.0)

## 2017-08-23 ENCOUNTER — Encounter: Payer: BC Managed Care – PPO | Admitting: Obstetrics and Gynecology

## 2017-08-28 ENCOUNTER — Ambulatory Visit (INDEPENDENT_AMBULATORY_CARE_PROVIDER_SITE_OTHER): Payer: BC Managed Care – PPO | Admitting: Obstetrics and Gynecology

## 2017-08-28 ENCOUNTER — Encounter: Payer: Self-pay | Admitting: Obstetrics and Gynecology

## 2017-08-28 VITALS — BP 118/73 | HR 102 | Ht 63.0 in | Wt 130.6 lb

## 2017-08-28 DIAGNOSIS — Z01419 Encounter for gynecological examination (general) (routine) without abnormal findings: Secondary | ICD-10-CM

## 2017-08-28 NOTE — Patient Instructions (Signed)
Preventive Care 18-39 Years, Female Preventive care refers to lifestyle choices and visits with your health care provider that can promote health and wellness. What does preventive care include?  A yearly physical exam. This is also called an annual well check.  Dental exams once or twice a year.  Routine eye exams. Ask your health care provider how often you should have your eyes checked.  Personal lifestyle choices, including: ? Daily care of your teeth and gums. ? Regular physical activity. ? Eating a healthy diet. ? Avoiding tobacco and drug use. ? Limiting alcohol use. ? Practicing safe sex. ? Taking vitamin and mineral supplements as recommended by your health care provider. What happens during an annual well check? The services and screenings done by your health care provider during your annual well check will depend on your age, overall health, lifestyle risk factors, and family history of disease. Counseling Your health care provider may ask you questions about your:  Alcohol use.  Tobacco use.  Drug use.  Emotional well-being.  Home and relationship well-being.  Sexual activity.  Eating habits.  Work and work Statistician.  Method of birth control.  Menstrual cycle.  Pregnancy history.  Screening You may have the following tests or measurements:  Height, weight, and BMI.  Diabetes screening. This is done by checking your blood sugar (glucose) after you have not eaten for a while (fasting).  Blood pressure.  Lipid and cholesterol levels. These may be checked every 5 years starting at age 38.  Skin check.  Hepatitis C blood test.  Hepatitis B blood test.  Sexually transmitted disease (STD) testing.  BRCA-related cancer screening. This may be done if you have a family history of breast, ovarian, tubal, or peritoneal cancers.  Pelvic exam and Pap test. This may be done every 3 years starting at age 38. Starting at age 30, this may be done  every 5 years if you have a Pap test in combination with an HPV test.  Discuss your test results, treatment options, and if necessary, the need for more tests with your health care provider. Vaccines Your health care provider may recommend certain vaccines, such as:  Influenza vaccine. This is recommended every year.  Tetanus, diphtheria, and acellular pertussis (Tdap, Td) vaccine. You may need a Td booster every 10 years.  Varicella vaccine. You may need this if you have not been vaccinated.  HPV vaccine. If you are 39 or younger, you may need three doses over 6 months.  Measles, mumps, and rubella (MMR) vaccine. You may need at least one dose of MMR. You may also need a second dose.  Pneumococcal 13-valent conjugate (PCV13) vaccine. You may need this if you have certain conditions and were not previously vaccinated.  Pneumococcal polysaccharide (PPSV23) vaccine. You may need one or two doses if you smoke cigarettes or if you have certain conditions.  Meningococcal vaccine. One dose is recommended if you are age 68-21 years and a first-year college student living in a residence hall, or if you have one of several medical conditions. You may also need additional booster doses.  Hepatitis A vaccine. You may need this if you have certain conditions or if you travel or work in places where you may be exposed to hepatitis A.  Hepatitis B vaccine. You may need this if you have certain conditions or if you travel or work in places where you may be exposed to hepatitis B.  Haemophilus influenzae type b (Hib) vaccine. You may need this  if you have certain risk factors.  Talk to your health care provider about which screenings and vaccines you need and how often you need them. This information is not intended to replace advice given to you by your health care provider. Make sure you discuss any questions you have with your health care provider. Document Released: 01/08/2002 Document Revised:  08/01/2016 Document Reviewed: 09/13/2015 Elsevier Interactive Patient Education  2017 Elsevier Inc.  

## 2017-08-28 NOTE — Progress Notes (Signed)
Subjective:   Sandra Cardenas is a 31 y.o. G24P1001 Hispanic female here for a routine well-woman exam.  No LMP recorded. Patient is not currently having periods (Reason: Lactating).    Current complaints: none PCP: Sanaytaga  doesn't desire labs  Social History: Sexual: heterosexual Marital Status: married Living situation: with family Occupation: homemaker Tobacco/alcohol: no tobacco use Illicit drugs: no history of illicit drug use  The following portions of the patient's history were reviewed and updated as appropriate: allergies, current medications, past family history, past medical history, past social history, past surgical history and problem list.  Past Medical History Past Medical History:  Diagnosis Date  . Frequent headaches     Past Surgical History Past Surgical History:  Procedure Laterality Date  . CHOLECYSTECTOMY N/A 05/04/2017   Procedure: LAPAROSCOPIC CHOLECYSTECTOMY;  Surgeon: Ancil Linsey, MD;  Location: [facility] ORS;  Service: General;  Laterality: N/A;    Gynecologic History G1P1001  No LMP recorded. Patient is not currently having periods (Reason: Lactating). Contraception: condoms Last Pap: ?Marland Kitchen Results were: normal   Obstetric History OB History  Gravida Para Term Preterm AB Living  SAB TAB Ectopic Multiple Live Births        0 1    # Outcome Date GA Lbr Len/2nd Weight Sex Delivery Anes PTL Lv  1 Term 04/16/17 [redacted]w[redacted]d 02:40 / 00:19 5 lb 10.3 oz (2.56 kg) F Vag-Spont None  LIV      Current Medications No current outpatient prescriptions on file prior to visit.   No current facility-administered medications on file prior to visit.     Review of Systems Patient denies any headaches, blurred vision, shortness of breath, chest pain, abdominal pain, problems with bowel movements, urination, or intercourse.  Objective:  BP 118/73   Pulse (!) 102   Ht  (1.6 m)   Wt 130 lb 9.6 oz (59.2 kg)   Breastfeeding? Yes   BMI 23.13 kg/m   Physical Exam  General:  Well developed, well nourished, no acute distress. She is alert and oriented x3. Skin:  Warm and dry Neck:  Midline trachea, no thyromegaly or nodules Cardiovascular: Regular rate and rhythm, no murmur heard Lungs:  Effort normal, all lung fields clear to auscultation bilaterally Breasts:  No dominant palpable mass, retraction, or nipple discharge Abdomen:  Soft, non tender, no hepatosplenomegaly or masses Pelvic:  External genitalia is normal in appearance.  The vagina is normal in appearance. The cervix is bulbous, no CMT.  Thin prep pap is done with HR HPV cotesting. Uterus is felt to be normal size, shape, and contour.  No adnexal masses or tenderness noted. Extremities:  No swelling or varicosities noted Psych:  She has a normal mood and affect  Assessment:   Healthy well-woman exam Lactational amenorrhea  Plan:   F/U 1 year for AE, or sooner if needed   [employer] Suzan Nailer, CNM

## 2017-08-29 ENCOUNTER — Other Ambulatory Visit: Payer: Self-pay | Admitting: Obstetrics and Gynecology

## 2017-08-30 LAB — CYTOLOGY - PAP

## 2018-03-05 IMAGING — US US ABDOMEN LIMITED
1 series · 14 of 25 positions shown · non-contrast
Comparison: None.

CLINICAL DATA: Right upper quadrant pain radiating to the back.
Vomiting.

EXAM:
ULTRASOUND ABDOMEN LIMITED RIGHT UPPER QUADRANT

[Series 1: us abdomen limited · 0.22mm/px · 14 of 57 slices shown]
[im 1/57]
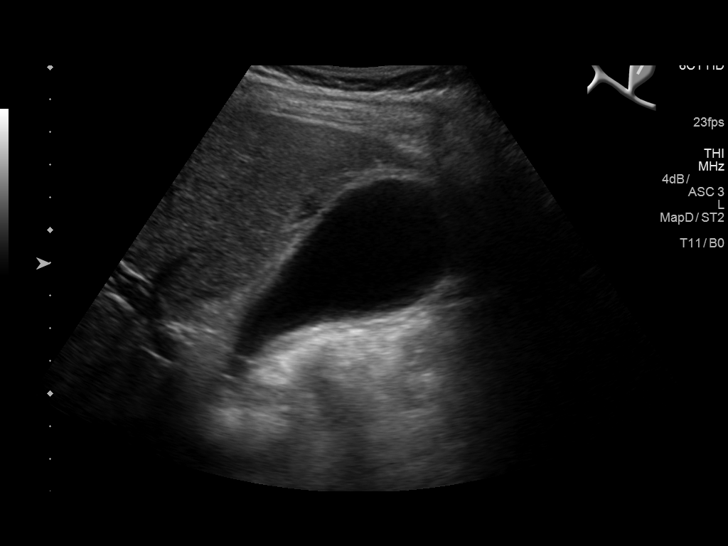
[im 5/57]
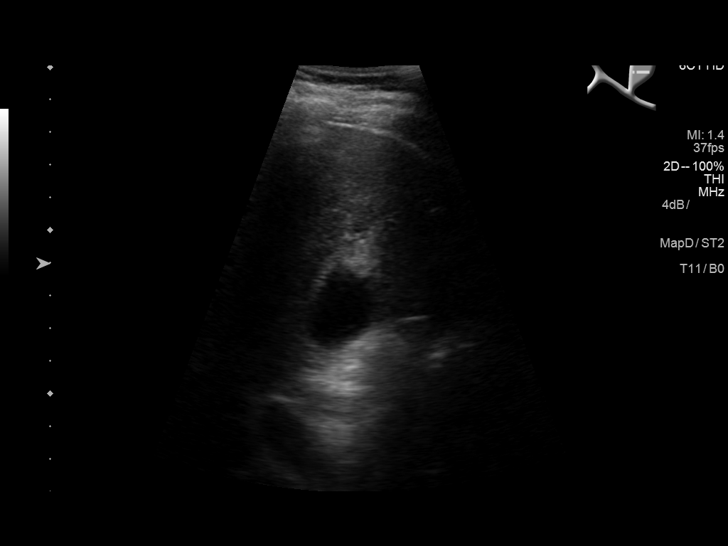
[im 10/57]
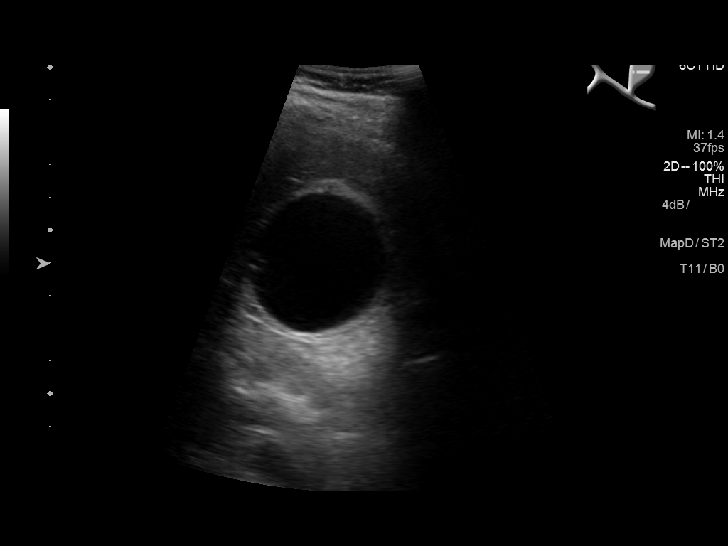
[im 15/57]
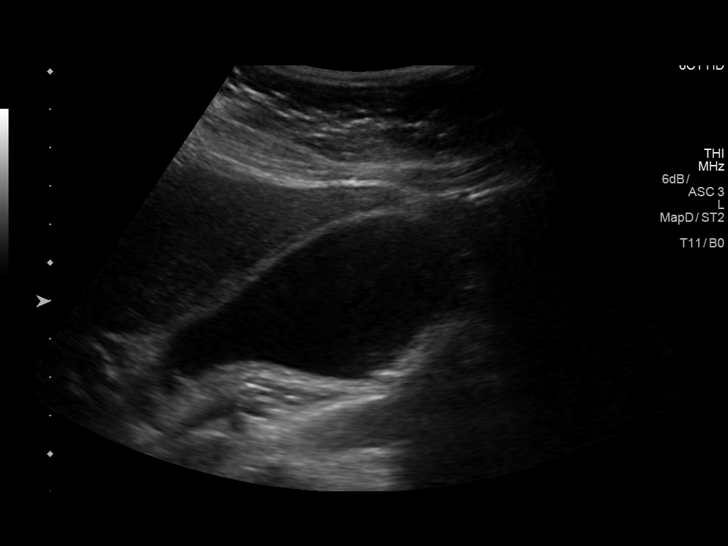
[im 19/57]
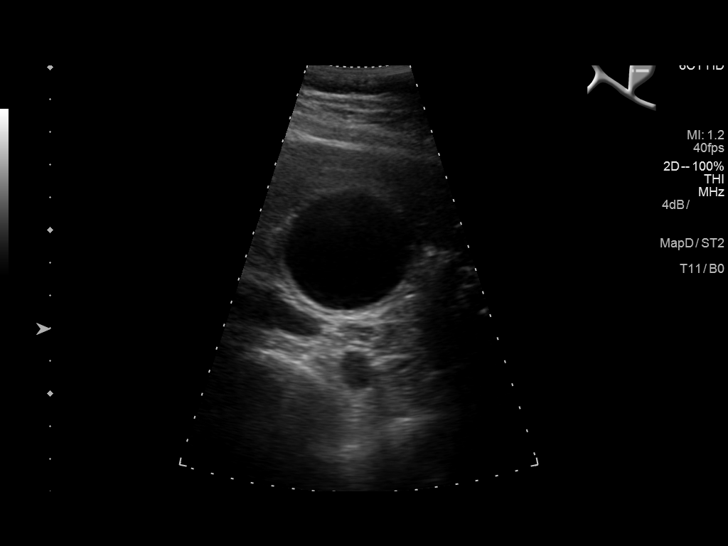
[im 22/57]
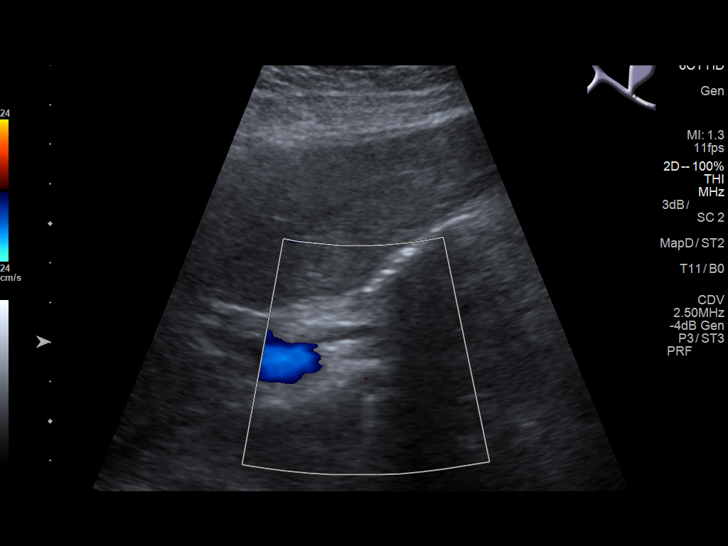
[im 26/57]
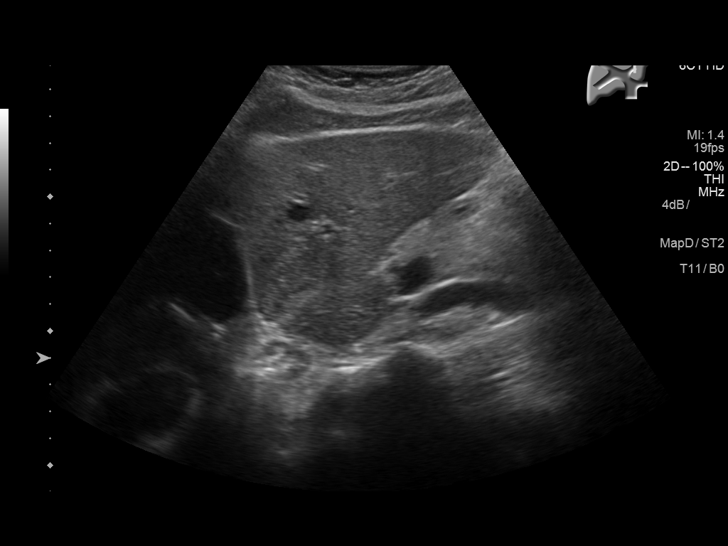
[im 31/57]
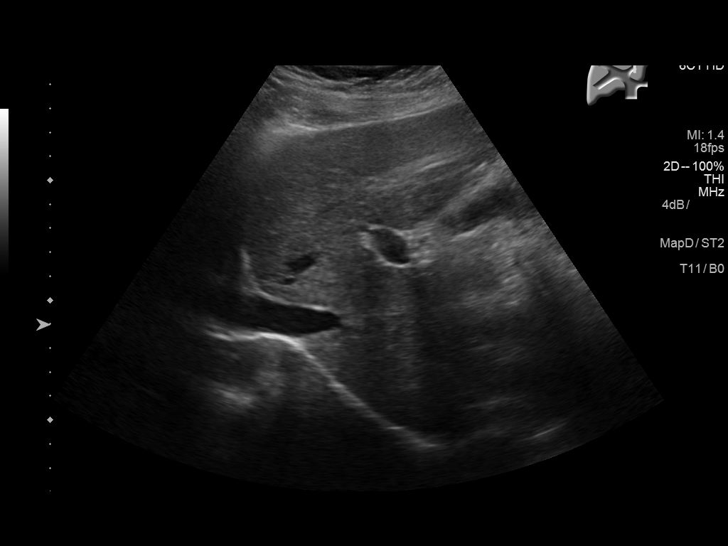
[im 36/57]
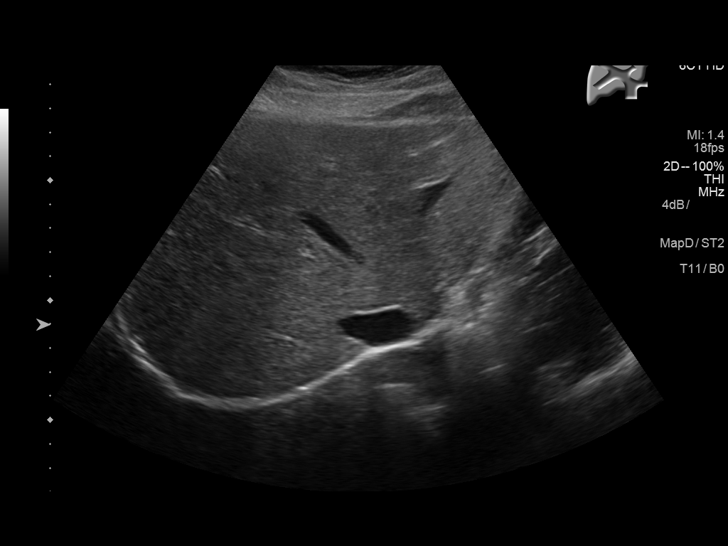
[im 38/57]
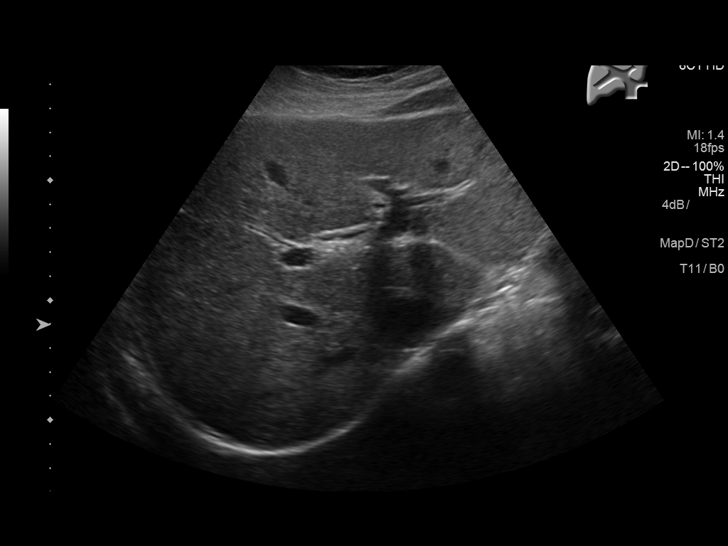
[im 43/57]
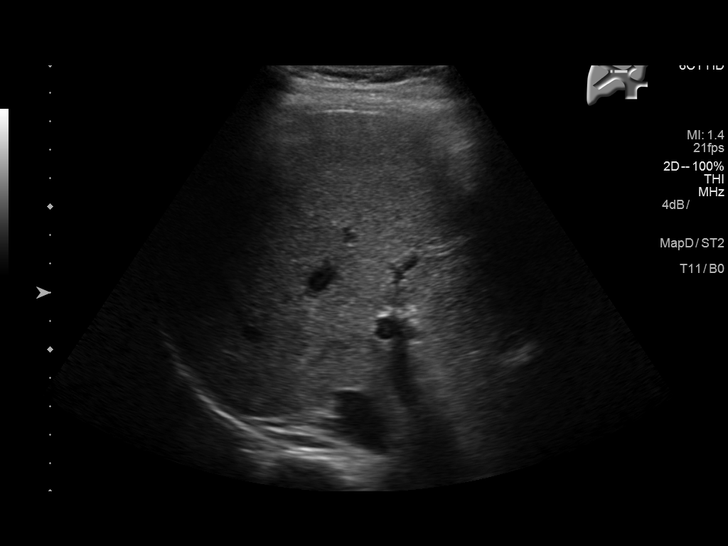
[im 47/57]
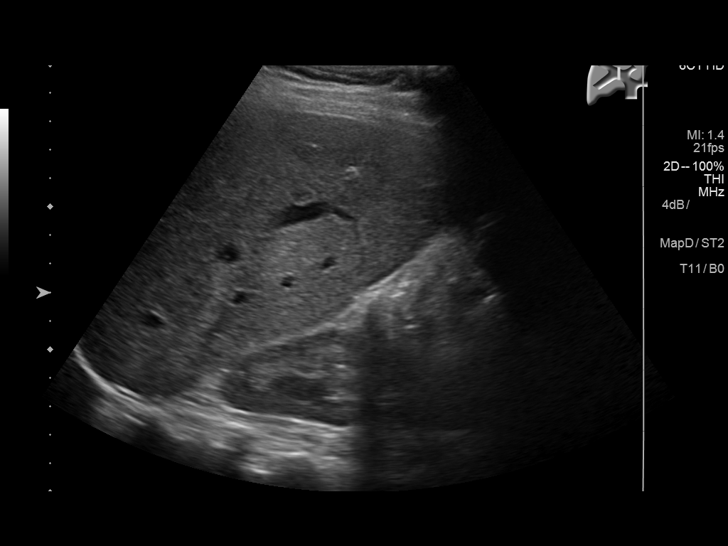
[im 52/57]
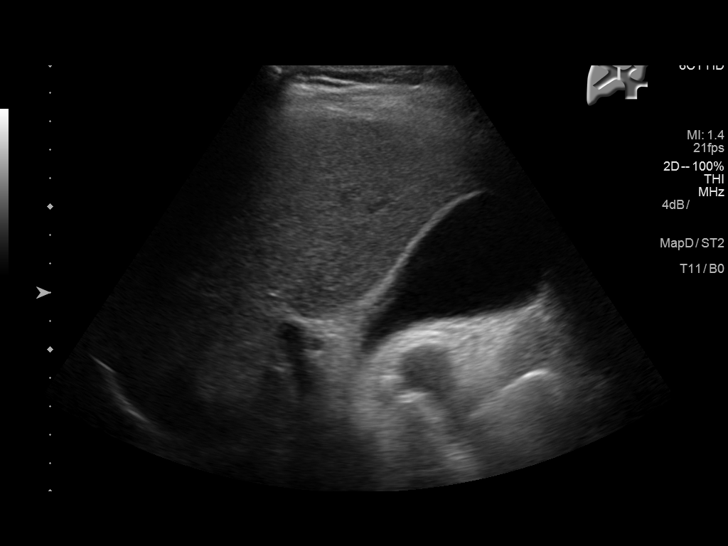
[im 57/57]
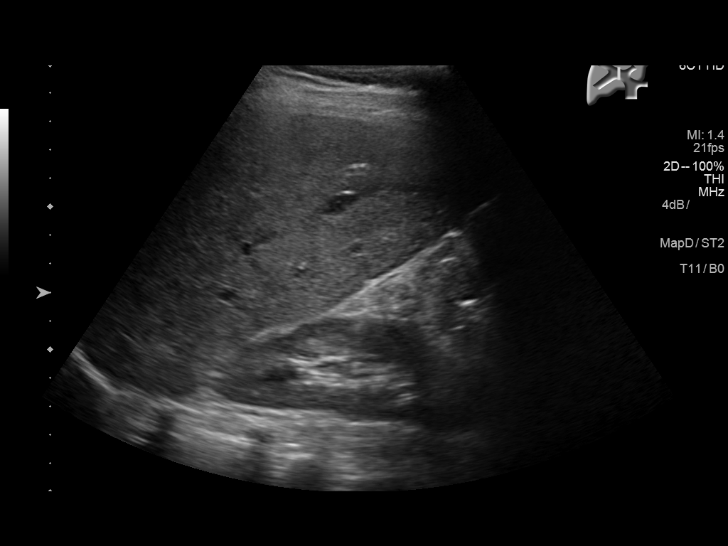

[14 of 25 positions shown; findings below may reference images not displayed]

FINDINGS: Gallbladder:

Physiologically distended. Multiple mobile gallstones largest
measuring 7 mm. Mild gallbladder wall thickening of 4 mm. No
sonographic Murphy sign noted by sonographer.

Common bile duct:

Diameter: 4 mm, normal.

Liver:

No focal lesion identified. Within normal limits in parenchymal
echogenicity. Normal directional flow in the main portal vein.
IMPRESSION: 1. Cholelithiasis with mild gallbladder wall thickening. This may
reflect acute cholecystitis in the appropriate clinical setting.
Negative sonographic Murphy sign, however patient received morphine
prior to the exam.
2. No biliary dilatation.

## 2018-03-08 IMAGING — US US ABDOMEN LIMITED
1 series · 14 of 25 positions shown · non-contrast
Comparison: 05/04/2017

CLINICAL DATA: Severe epigastric and right upper quadrant pain for
3 days after cholecystectomy. Concern for retained stone.

EXAM:
ULTRASOUND ABDOMEN LIMITED RIGHT UPPER QUADRANT

[Series 1: us abdomen limited · 0.20mm/px · 14 of 31 slices shown]
[im 1/31]
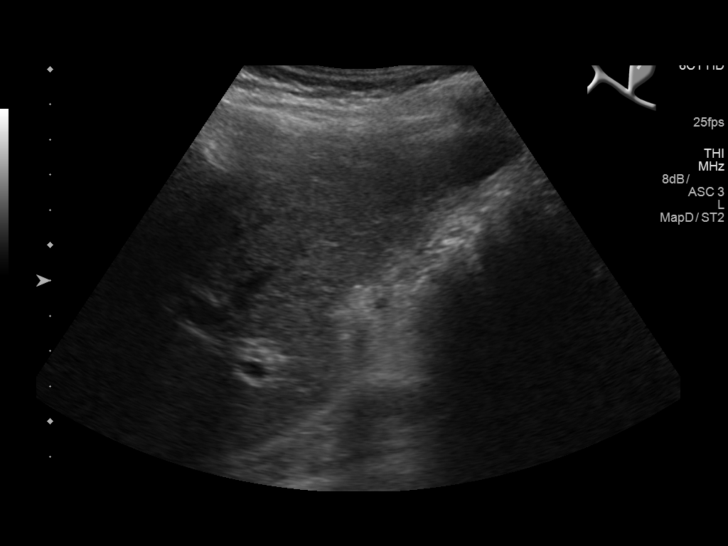
[im 3/31]
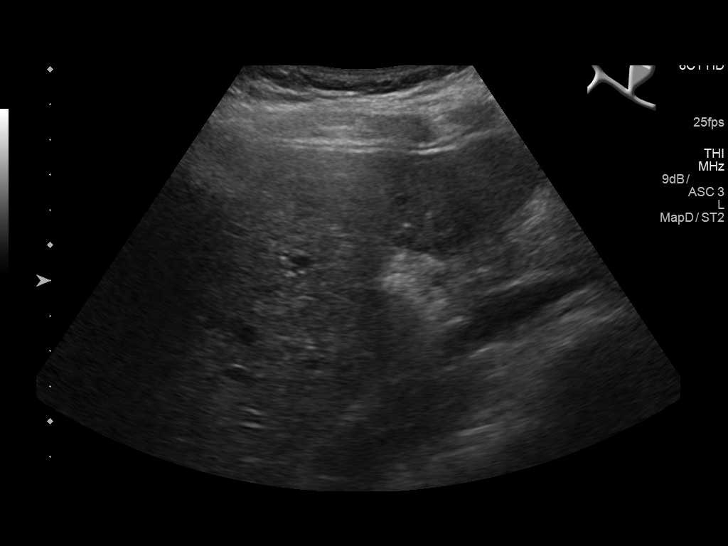
[im 6/31]
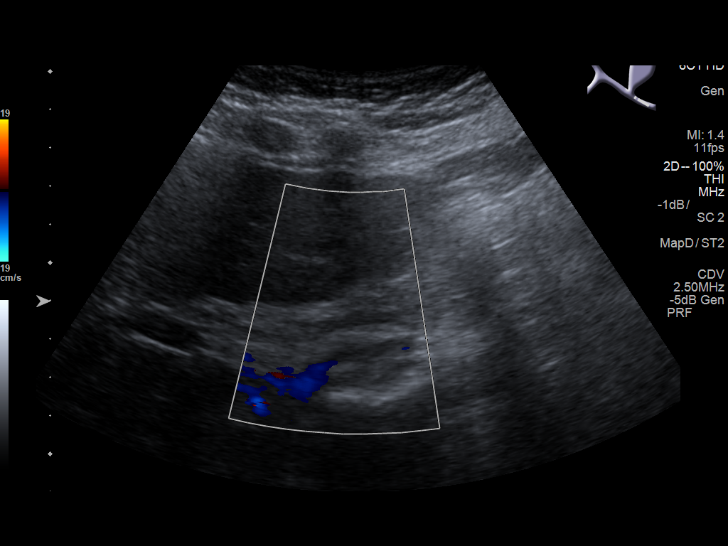
[im 8/31]
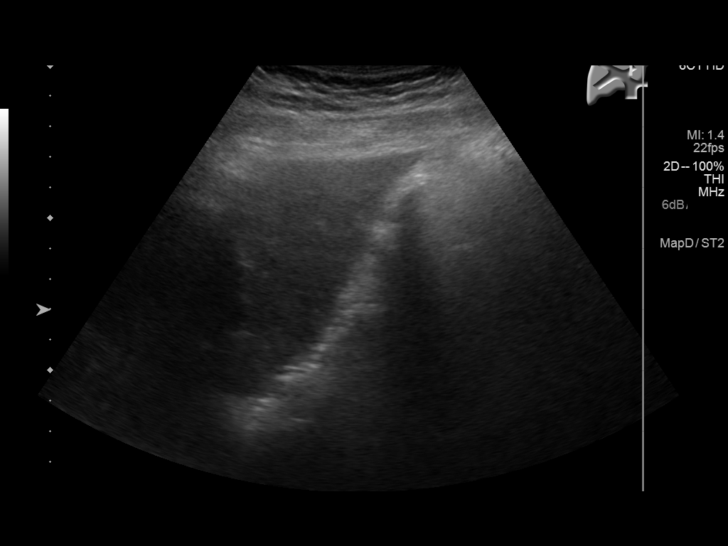
[im 11/31]
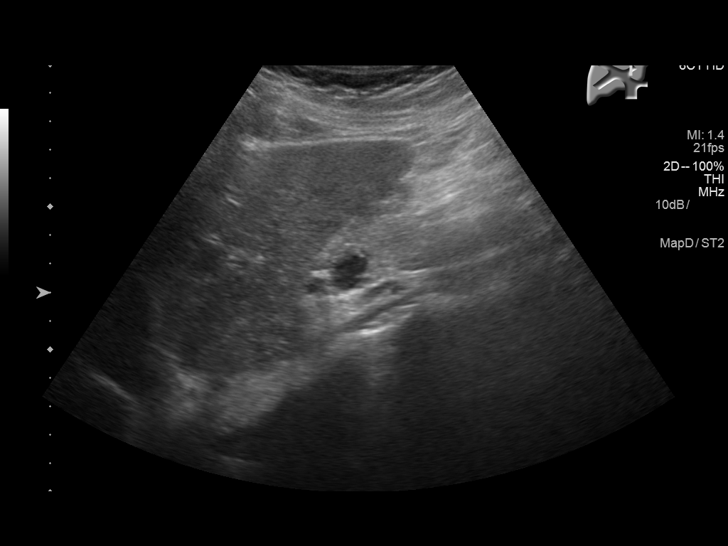
[im 12/31]
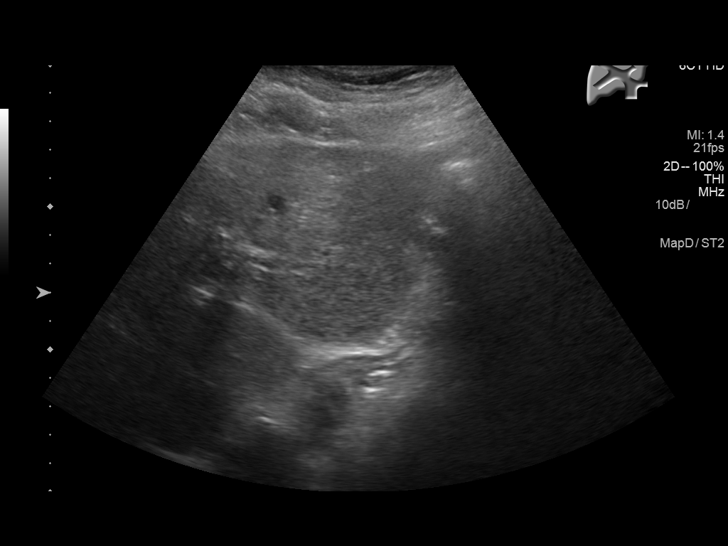
[im 14/31]
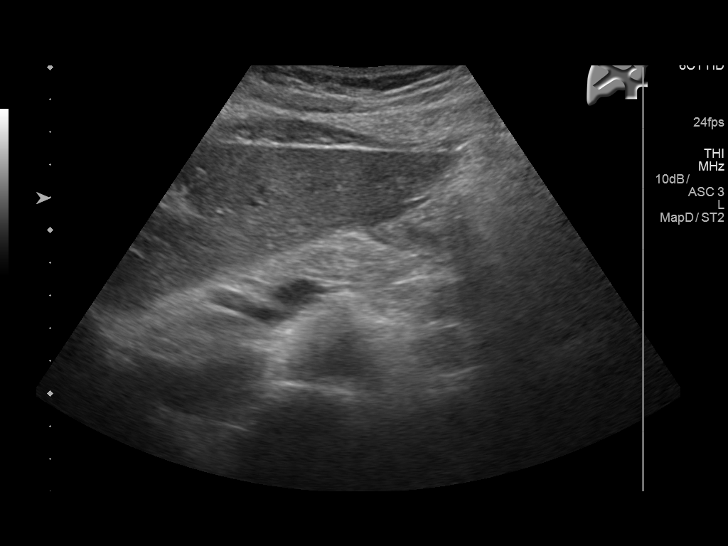
[im 17/31]
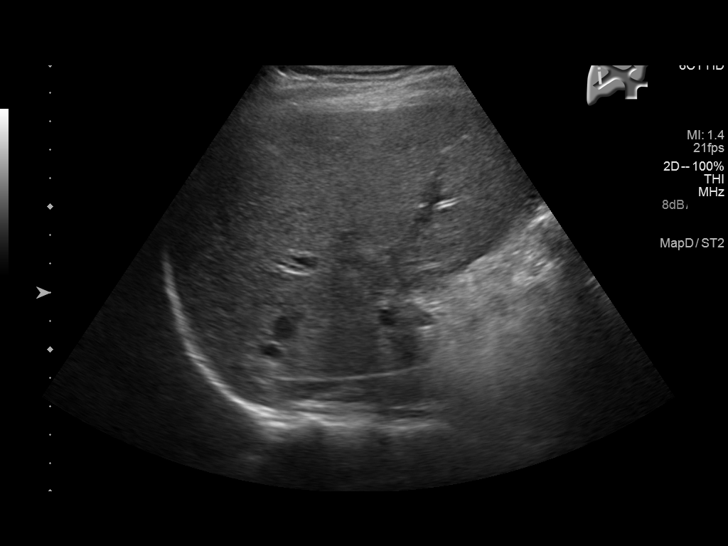
[im 19/31]
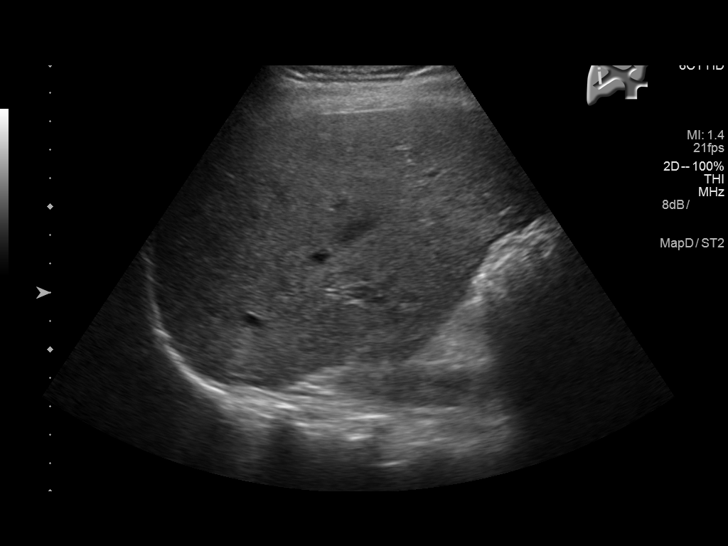
[im 21/31]
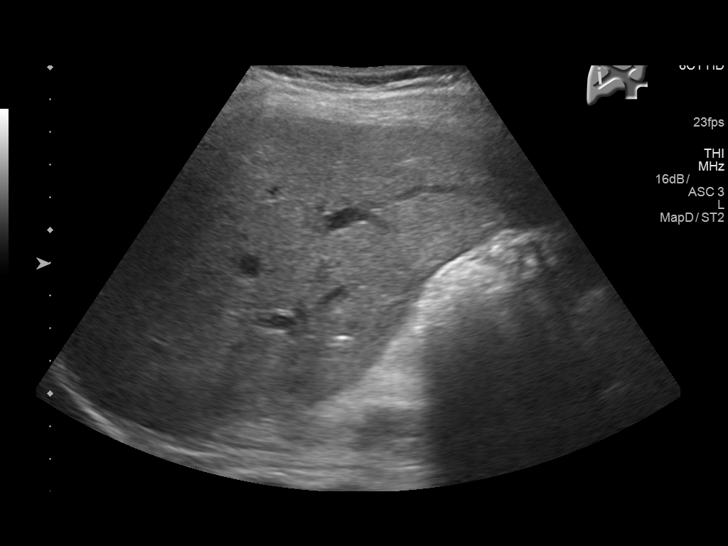
[im 23/31]
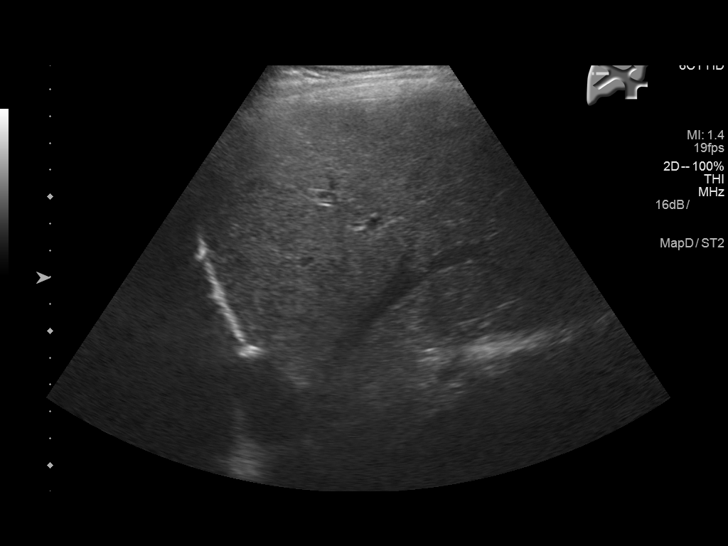
[im 26/31]
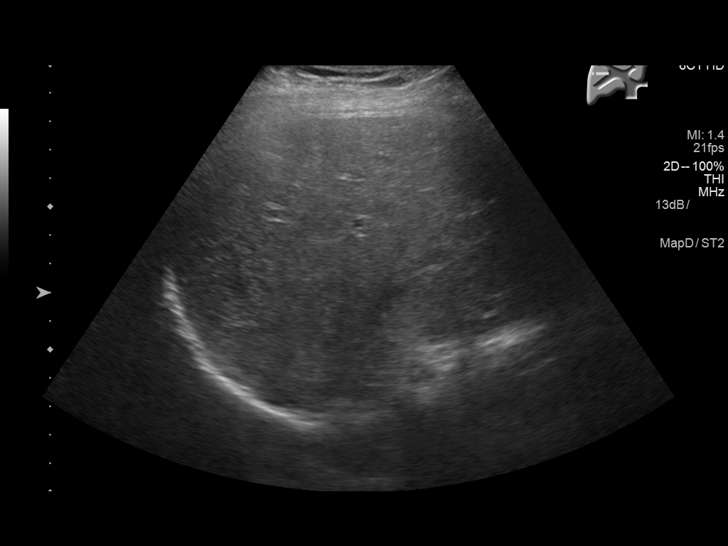
[im 28/31]
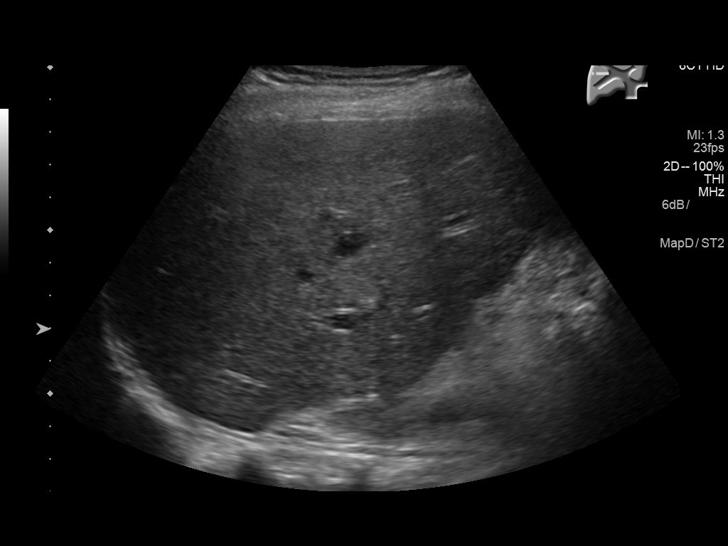
[im 31/31]
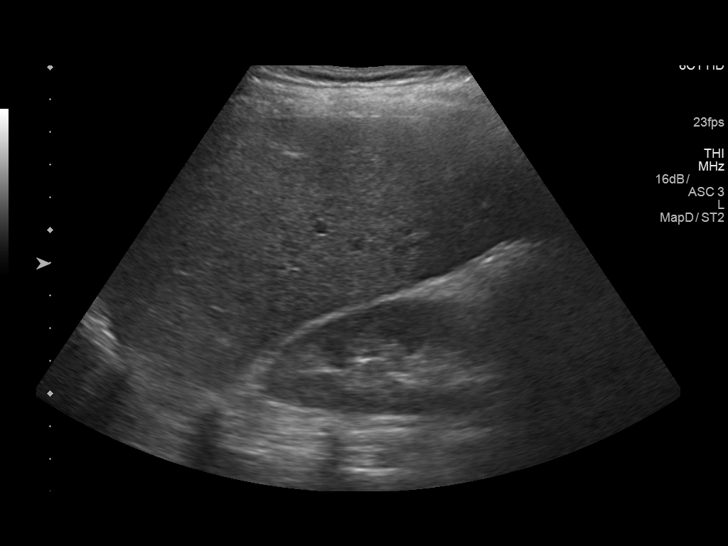

[14 of 25 positions shown; findings below may reference images not displayed]

FINDINGS: Gallbladder:

Surgically absent. No evidence of fluid collection in the
gallbladder fossa.

Common bile duct:

Diameter: 4 mm.  Where visualized, no filling defect.

Liver:

No focal lesion identified. Within normal limits in parenchymal
echogenicity. Antegrade flow in the imaged hepatic and portal venous
system.
IMPRESSION: No unexpected finding after cholecystectomy. No common bile duct
dilatation or visualized perihepatic fluid collection.

## 2018-05-14 IMAGING — CT CT ABD-PELV W/ CM
2 of 5 series · 15 of 46 positions shown, 17 images · IV contrast (APPLIED)
Comparison: Right upper quadrant ultrasound performed earlier today
at [DATE] p.m.

CLINICAL DATA: Acute onset of right upper quadrant abdominal pain
and nausea. Constipation. Initial encounter.

EXAM:
CT ABDOMEN AND PELVIS WITH CONTRAST
TECHNIQUE: Multidetector CT imaging of the abdomen and pelvis was performed
using the standard protocol following bolus administration of
intravenous contrast.
CONTRAST:  100mL RVZDYN-NLL IOPAMIDOL (RVZDYN-NLL) INJECTION 61%

[Series 2: routine abd/pel with · axial · 0.72mm/px · z∈[-417,+8]mm · 12 of 101 slices shown, 14 images]
[im 8/101  soft-tissue]
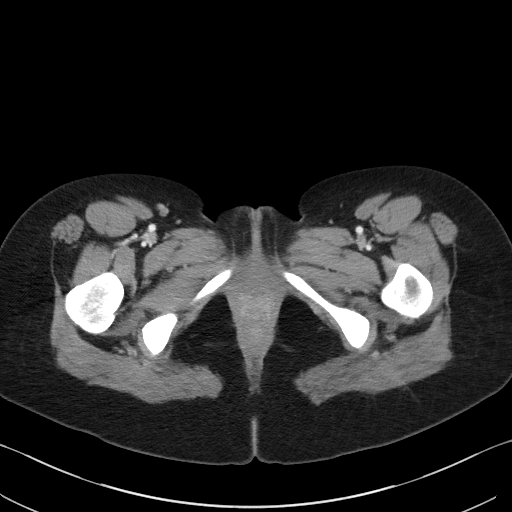
[im 8/101  bone]
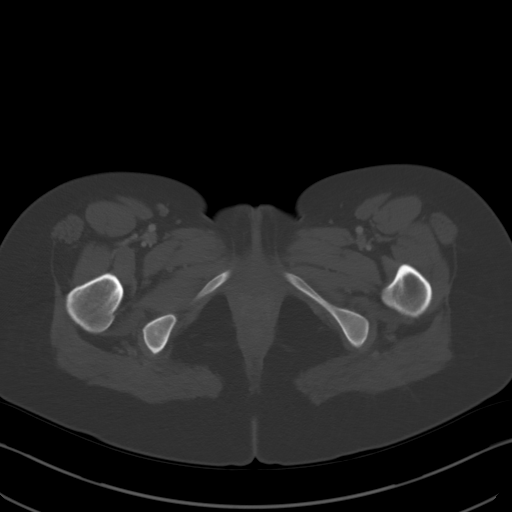
[im 15/101  soft-tissue]
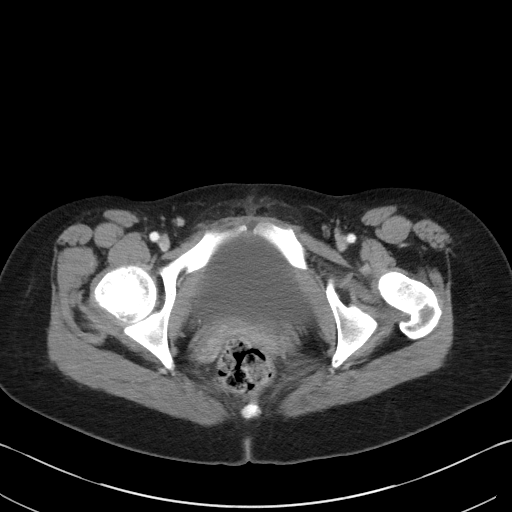
[im 22/101  soft-tissue]
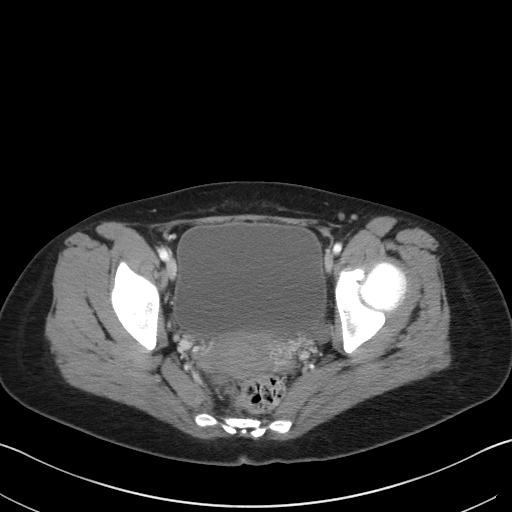
[im 29/101  soft-tissue]
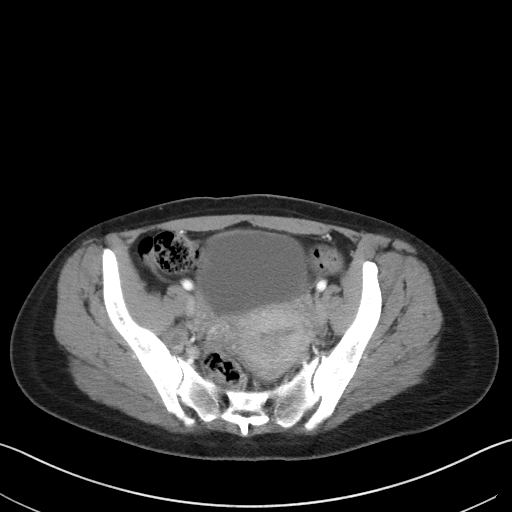
[im 36/101  soft-tissue]
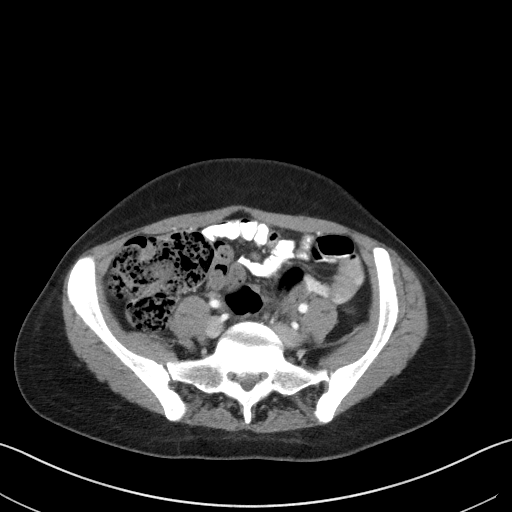
[im 43/101  soft-tissue]
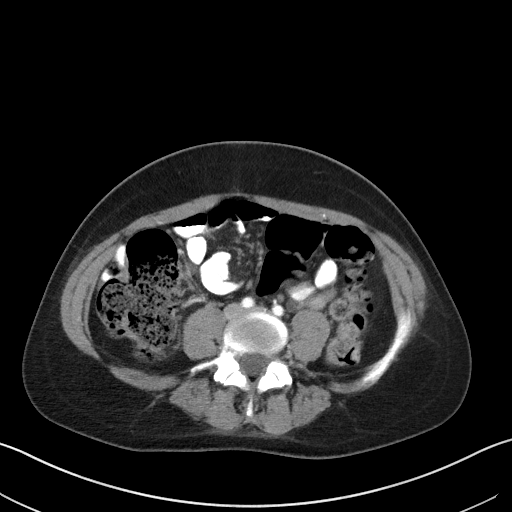
[im 58/101  soft-tissue]
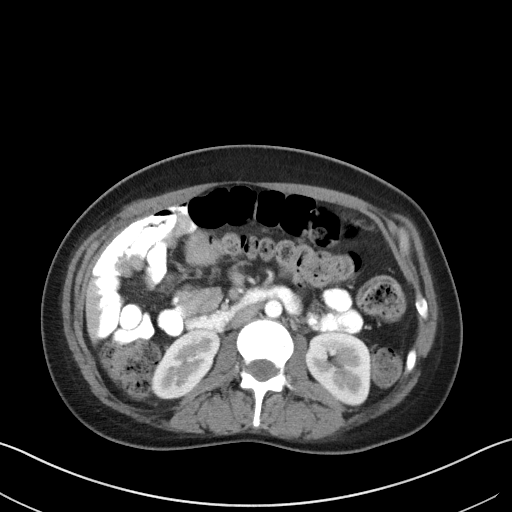
[im 65/101  soft-tissue]
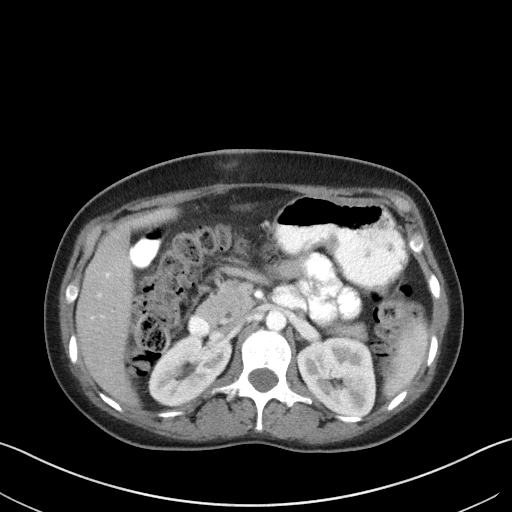
[im 72/101  soft-tissue]
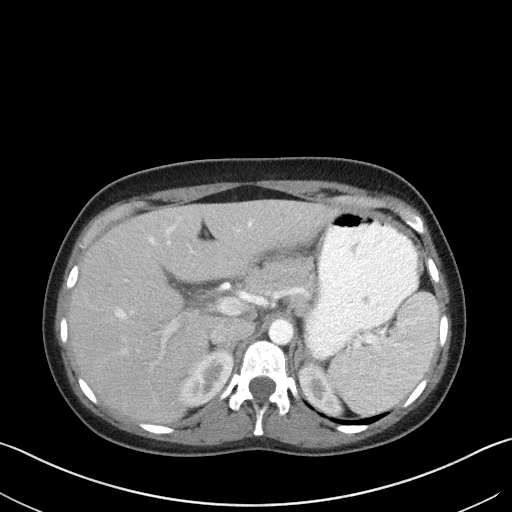
[im 72/101  bone]
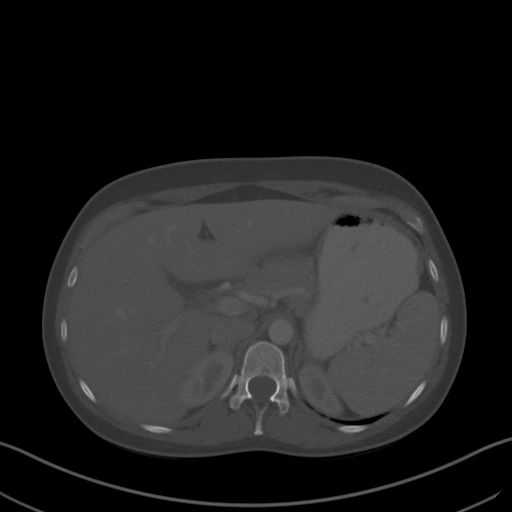
[im 79/101  soft-tissue]
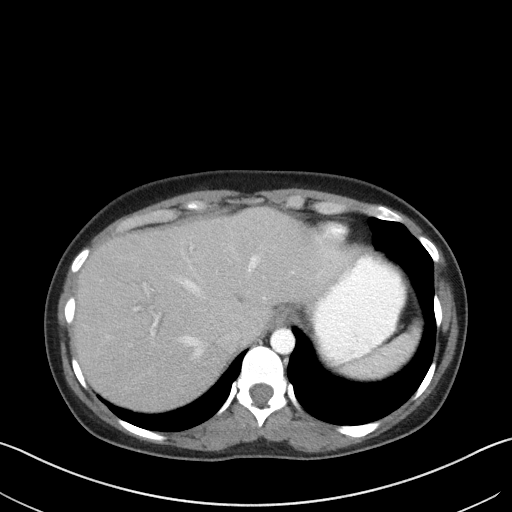
[im 86/101  soft-tissue]
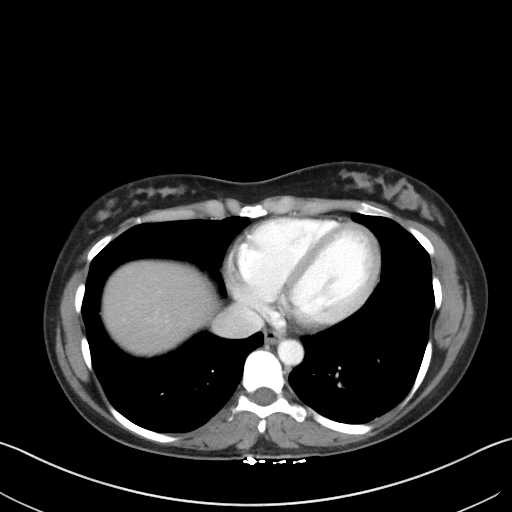
[im 93/101  soft-tissue]
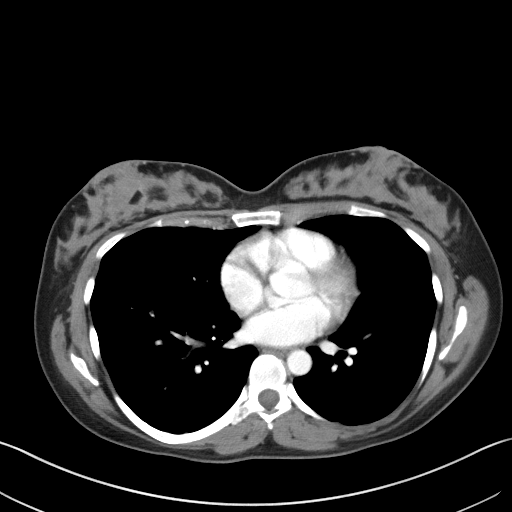

[Series 5: coronal st · coronal · 0.68mm/px · 3 of 77 slices shown]
[im 26/77  soft-tissue]
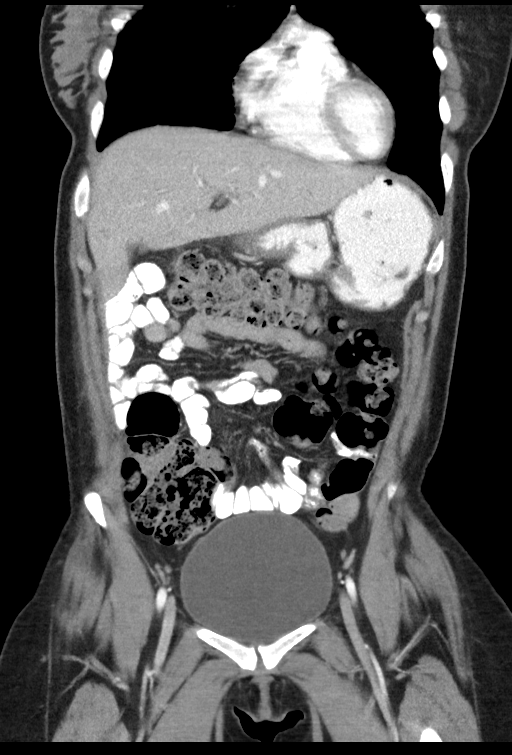
[im 34/77  soft-tissue]
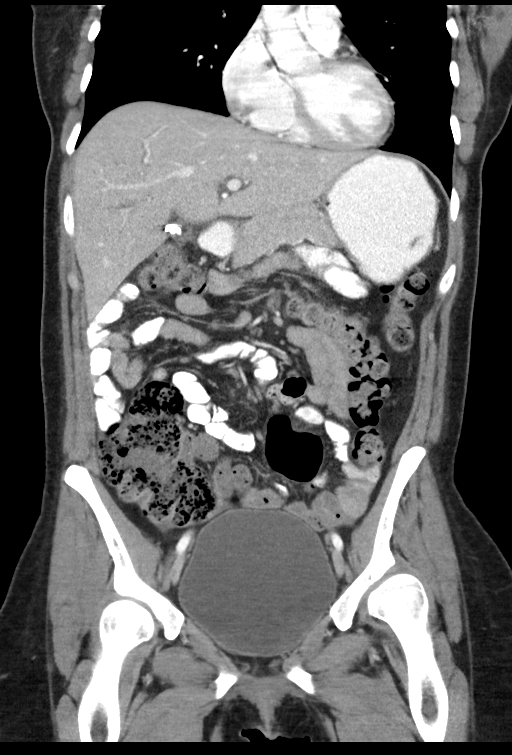
[im 43/77  soft-tissue]
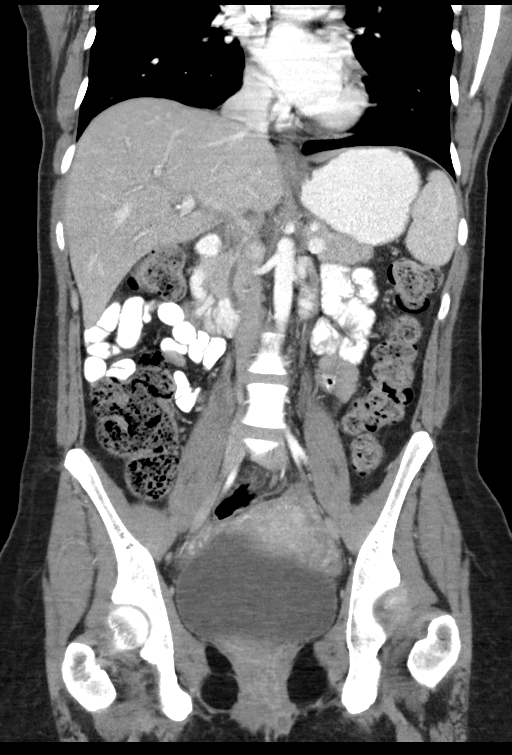

[15 of 46 positions shown; findings below may reference images not displayed]

FINDINGS: Lower chest: Minimal bibasilar atelectasis is noted. The visualized
portions of the mediastinum are unremarkable.

Hepatobiliary: The liver is unremarkable in appearance. The patient
is status post cholecystectomy, with clips noted at the gallbladder
fossa. Minimal postoperative soft tissue inflammation is noted about
the right upper quadrant, and along the anterior abdominal wall. The
common bile duct remains normal in caliber.

Pancreas: The pancreas is within normal limits.

Spleen: The spleen is unremarkable in appearance.

Adrenals/Urinary Tract: The adrenal glands are unremarkable in
appearance. The kidneys are within normal limits. There is no
evidence of hydronephrosis. No renal or ureteral stones are
identified. No perinephric stranding is seen.

Stomach/Bowel: The stomach is unremarkable in appearance. The small
bowel is within normal limits. The appendix is not visualized; there
is no evidence for appendicitis. The colon is unremarkable in
appearance.

Vascular/Lymphatic: The abdominal aorta is unremarkable in
appearance. The inferior vena cava is grossly unremarkable. No
retroperitoneal lymphadenopathy is seen. No pelvic sidewall
lymphadenopathy is identified.

Reproductive: The bladder is moderately distended and grossly
remarkable. The uterus is grossly unremarkable in appearance. The
ovaries are relatively symmetric. No suspicious adnexal masses are
seen. A clip is noted at the right adnexa.

Other: No additional soft tissue abnormalities are seen.

Musculoskeletal: No acute osseous abnormalities are identified. The
visualized musculature is unremarkable in appearance.
IMPRESSION: 1. No acute abnormality seen within the abdomen or pelvis.
2. Status post cholecystectomy, with minimal postoperative soft
tissue inflammation about the right upper quadrant and along the
anterior abdominal wall.

## 2019-01-09 ENCOUNTER — Other Ambulatory Visit: Payer: Self-pay | Admitting: Medical Oncology

## 2019-01-09 DIAGNOSIS — N921 Excessive and frequent menstruation with irregular cycle: Secondary | ICD-10-CM

## 2019-01-14 ENCOUNTER — Ambulatory Visit
Admission: RE | Admit: 2019-01-14 | Discharge: 2019-01-14 | Disposition: A | Discharge status: Home / Self Care | Payer: BC Managed Care – PPO | Source: Ambulatory Visit | Attending: Medical Oncology | Admitting: Medical Oncology

## 2019-01-14 DIAGNOSIS — N921 Excessive and frequent menstruation with irregular cycle: Secondary | ICD-10-CM | POA: Diagnosis not present

## 2019-09-03 ENCOUNTER — Other Ambulatory Visit: Payer: Self-pay | Admitting: Acute Care

## 2019-09-03 DIAGNOSIS — R202 Paresthesia of skin: Secondary | ICD-10-CM

## 2019-09-03 DIAGNOSIS — R200 Anesthesia of skin: Secondary | ICD-10-CM

## 2019-09-13 ENCOUNTER — Ambulatory Visit
Admission: RE | Admit: 2019-09-13 | Discharge: 2019-09-13 | Disposition: A | Discharge status: Home / Self Care | Payer: BC Managed Care – PPO | Source: Ambulatory Visit | Attending: Acute Care | Admitting: Acute Care

## 2019-09-13 ENCOUNTER — Other Ambulatory Visit: Payer: Self-pay

## 2019-09-13 DIAGNOSIS — R200 Anesthesia of skin: Secondary | ICD-10-CM | POA: Diagnosis present

## 2019-09-13 DIAGNOSIS — R202 Paresthesia of skin: Secondary | ICD-10-CM

## 2019-09-13 MED ORDER — GADOBUTROL 1 MMOL/ML IV SOLN
6.0000 mL | Freq: Once | INTRAVENOUS | Status: AC | PRN
  Administered 2019-09-13: 6 mL via INTRAVENOUS

## 2019-11-15 IMAGING — US US PELVIS COMPLETE WITH TRANSVAGINAL
1 series · 14 of 25 positions shown · non-contrast
Comparison: None

CLINICAL DATA: Irregular menstrual breathing

EXAM:
TRANSABDOMINAL AND TRANSVAGINAL ULTRASOUND OF PELVIS
TECHNIQUE: Both transabdominal and transvaginal ultrasound examinations of the
pelvis were performed. Transabdominal technique was performed for
global imaging of the pelvis including uterus, ovaries, adnexal
regions, and pelvic cul-de-sac. It was necessary to proceed with
endovaginal exam following the transabdominal exam to visualize the
endometrium and ovaries.

[Series 1: us pelvis complete with transvaginal · 0.24mm/px · 14 of 124 slices shown]
[im 1/124]
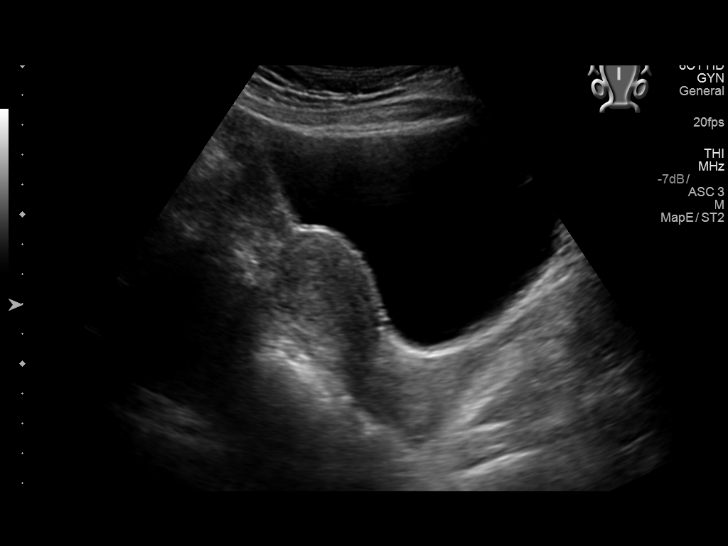
[im 11/124]
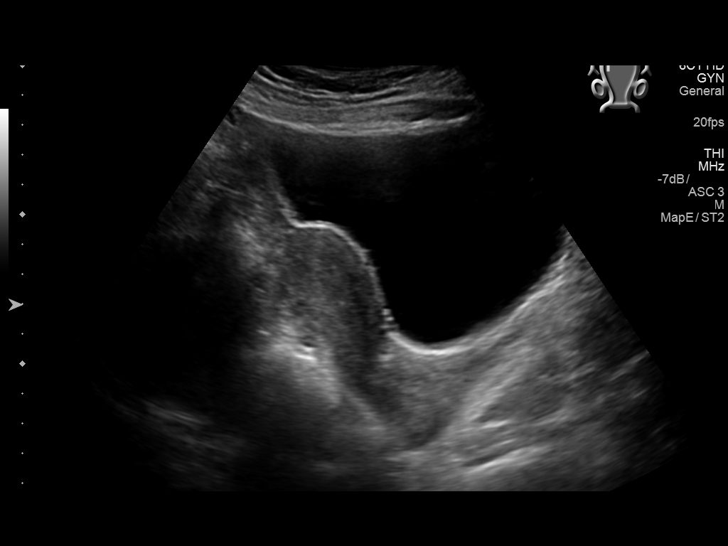
[im 21/124]
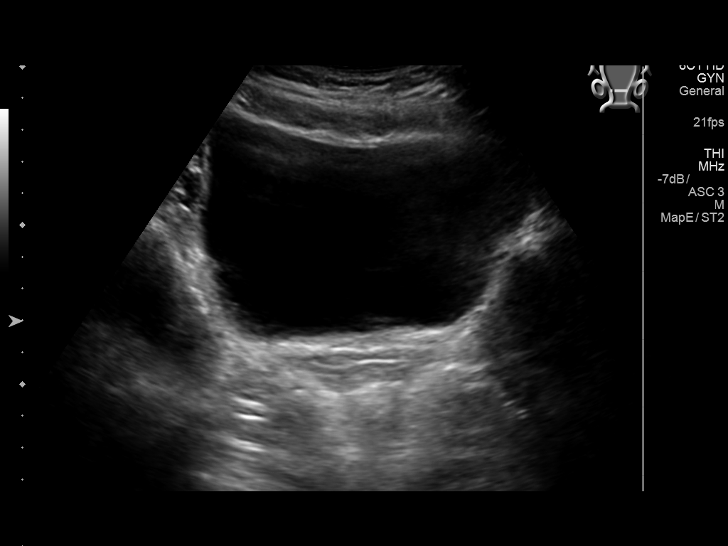
[im 31/124]
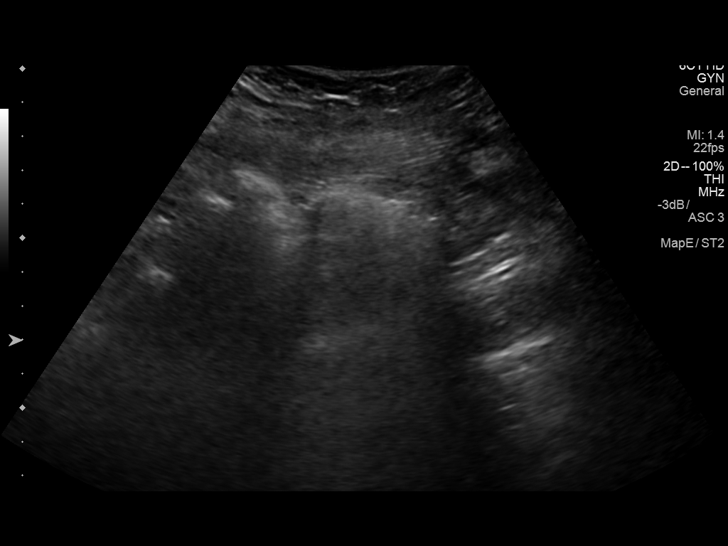
[im 42/124]
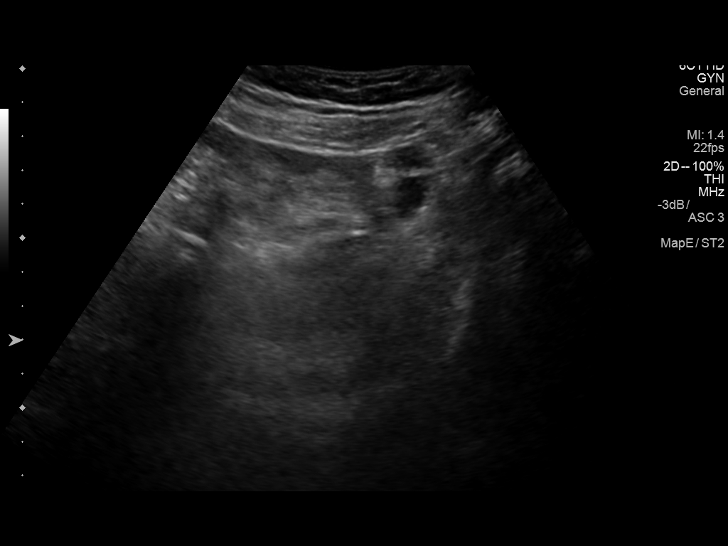
[im 47/124]
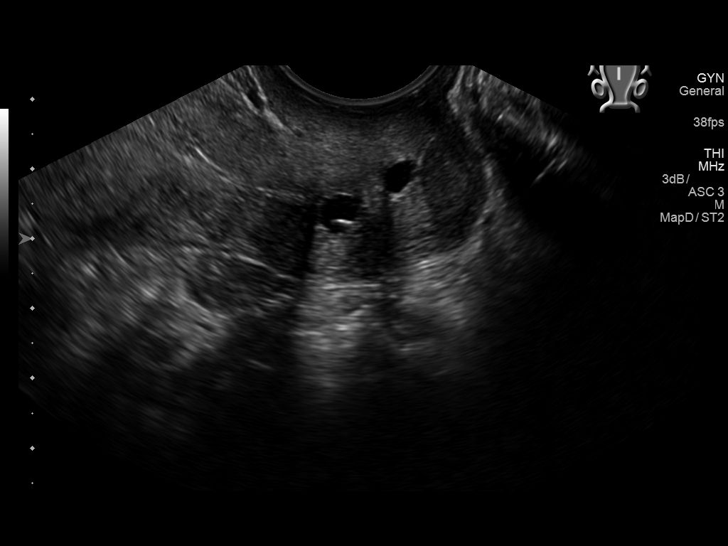
[im 57/124]
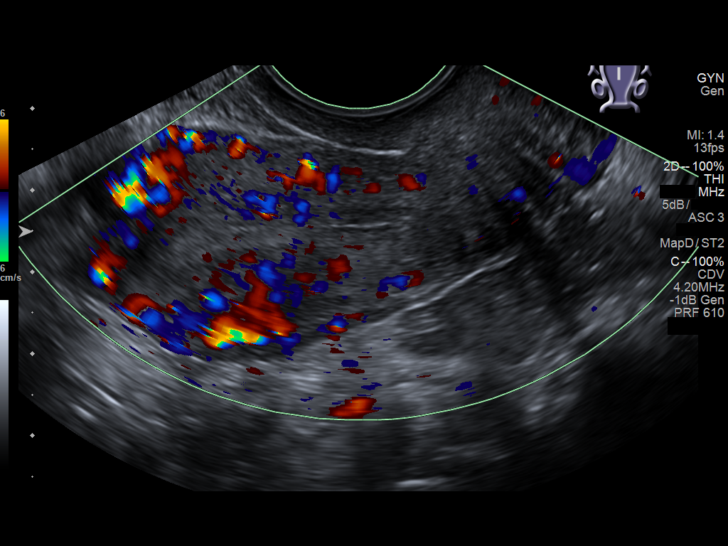
[im 67/124]
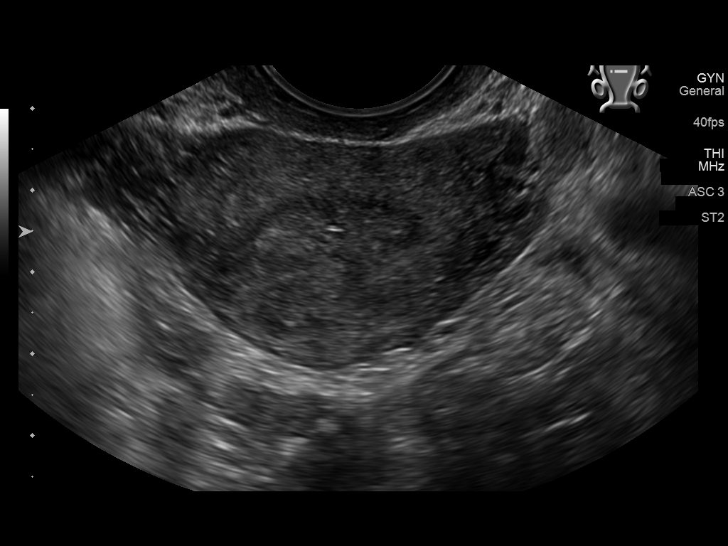
[im 77/124]
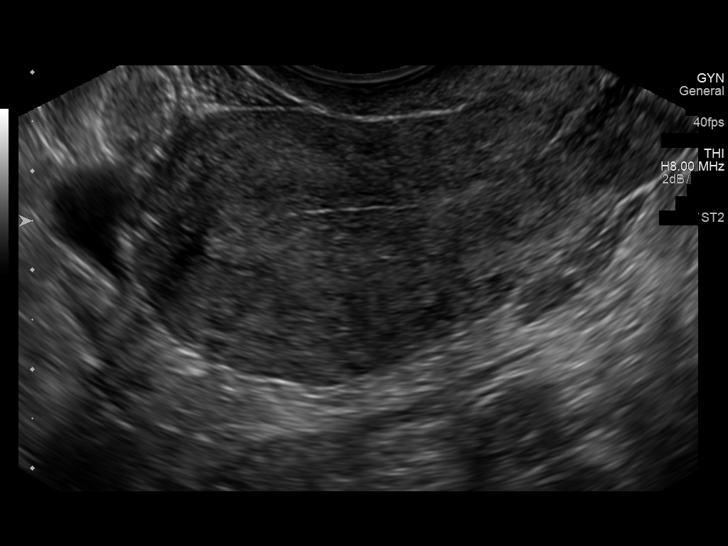
[im 83/124]
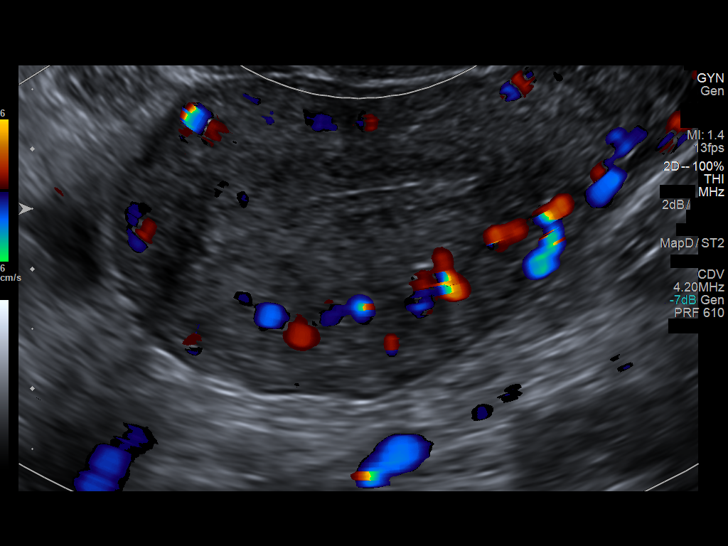
[im 93/124]
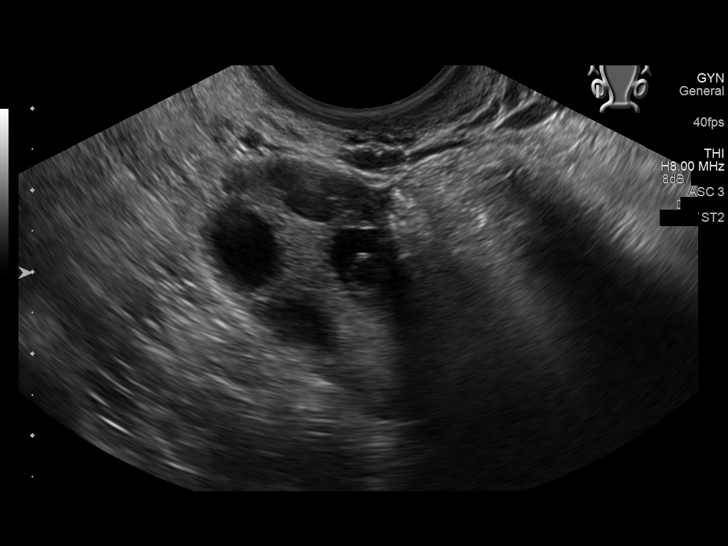
[im 103/124]
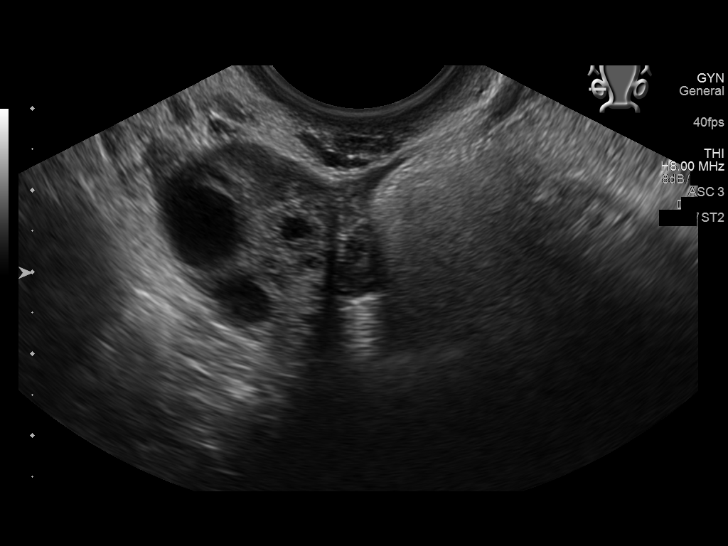
[im 113/124]
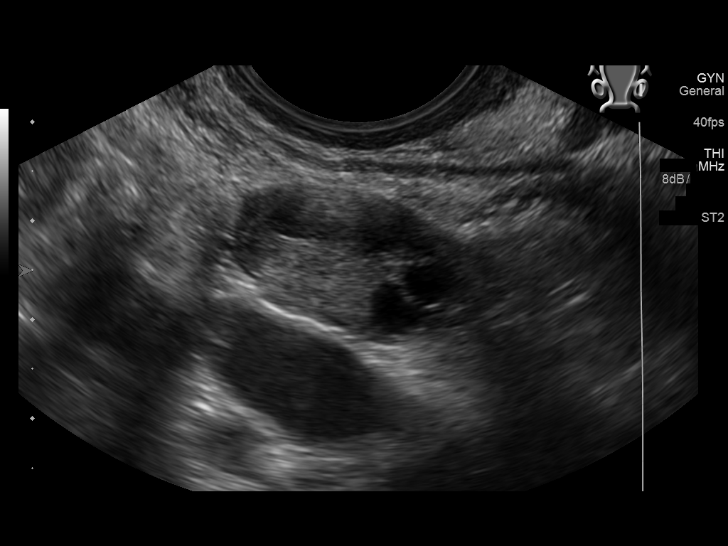
[im 124/124]
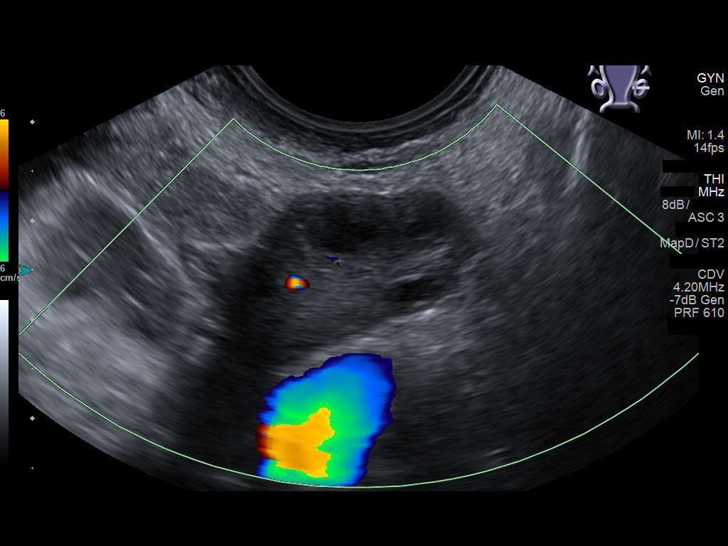

[14 of 25 positions shown; findings below may reference images not displayed]

FINDINGS: Uterus

Measurements: 8.3 x 3.3 x 4.2 cm = volume: 60.9 mL. Anteverted.
Nabothian cysts at cervix. Otherwise normal morphology. No focal
uterine mass.

Endometrium

Thickness: 8 mm.  No endometrial fluid or focal abnormality

Right ovary

Measurements: 3.3 x 2.1 x 2.1 cm = volume: 7.9 mL. Normal morphology
without mass. Internal blood flow present on color Doppler imaging.

Left ovary

Measurements: 2.6 x 2.0 x 2.2 cm = volume: 4.7 mL. Normal morphology
without mass. Internal blood flow present on color Doppler imaging.

Other findings

No free pelvic fluid.  No adnexal masses.
IMPRESSION: Normal exam.

## 2022-09-05 ENCOUNTER — Encounter: Payer: Self-pay | Admitting: Certified Nurse Midwife
